# Patient Record
Sex: Female | Born: 1965 | Race: White | Hispanic: No | Smoking: Former smoker
Health system: Southern US, Community
[De-identification: ages and names within clinical notes are randomized; demographics above are authoritative.]

## PROBLEM LIST (undated history)

## (undated) DIAGNOSIS — I82409 Acute embolism and thrombosis of unspecified deep veins of unspecified lower extremity: Secondary | ICD-10-CM

## (undated) DIAGNOSIS — F32A Depression, unspecified: Secondary | ICD-10-CM

## (undated) DIAGNOSIS — R569 Unspecified convulsions: Secondary | ICD-10-CM

## (undated) DIAGNOSIS — F329 Major depressive disorder, single episode, unspecified: Secondary | ICD-10-CM

## (undated) DIAGNOSIS — T7840XA Allergy, unspecified, initial encounter: Secondary | ICD-10-CM

## (undated) DIAGNOSIS — F419 Anxiety disorder, unspecified: Secondary | ICD-10-CM

## (undated) HISTORY — DX: Anxiety disorder, unspecified: F41.9

## (undated) HISTORY — PX: COSMETIC SURGERY: SHX468

## (undated) HISTORY — PX: DILATION AND CURETTAGE OF UTERUS: SHX78

## (undated) HISTORY — PX: BREAST BIOPSY: SHX20

## (undated) HISTORY — DX: Major depressive disorder, single episode, unspecified: F32.9

## (undated) HISTORY — PX: AUGMENTATION MAMMAPLASTY: SUR837

## (undated) HISTORY — PX: NASAL SEPTUM SURGERY: SHX37

## (undated) HISTORY — PX: EYE SURGERY: SHX253

## (undated) HISTORY — PX: MINOR RELEASE DORSAL COMPARTMENT (DEQUERVAINS): SHX5980

## (undated) HISTORY — PX: MOUTH SURGERY: SHX715

## (undated) HISTORY — DX: Allergy, unspecified, initial encounter: T78.40XA

## (undated) HISTORY — DX: Depression, unspecified: F32.A

## (undated) HISTORY — DX: Acute embolism and thrombosis of unspecified deep veins of unspecified lower extremity: I82.409

## (undated) HISTORY — PX: FRACTURE SURGERY: SHX138

---

## 2013-11-24 DIAGNOSIS — F5101 Primary insomnia: Secondary | ICD-10-CM | POA: Insufficient documentation

## 2013-11-24 DIAGNOSIS — M654 Radial styloid tenosynovitis [de Quervain]: Secondary | ICD-10-CM | POA: Insufficient documentation

## 2014-05-31 ENCOUNTER — Other Ambulatory Visit: Payer: Self-pay | Admitting: Physician Assistant

## 2014-11-09 DIAGNOSIS — F411 Generalized anxiety disorder: Secondary | ICD-10-CM | POA: Insufficient documentation

## 2015-05-26 DIAGNOSIS — F329 Major depressive disorder, single episode, unspecified: Secondary | ICD-10-CM | POA: Insufficient documentation

## 2015-09-05 ENCOUNTER — Other Ambulatory Visit: Payer: Self-pay | Admitting: Obstetrics & Gynecology

## 2015-09-05 DIAGNOSIS — N6310 Unspecified lump in the right breast, unspecified quadrant: Secondary | ICD-10-CM

## 2015-09-12 ENCOUNTER — Other Ambulatory Visit: Payer: Self-pay | Admitting: Obstetrics & Gynecology

## 2015-09-12 DIAGNOSIS — N6310 Unspecified lump in the right breast, unspecified quadrant: Secondary | ICD-10-CM

## 2015-09-13 ENCOUNTER — Ambulatory Visit
Admission: RE | Admit: 2015-09-13 | Discharge: 2015-09-13 | Disposition: A | Discharge status: Home / Self Care | Payer: BLUE CROSS/BLUE SHIELD | Source: Ambulatory Visit | Attending: Obstetrics & Gynecology | Admitting: Obstetrics & Gynecology

## 2015-09-13 DIAGNOSIS — N6310 Unspecified lump in the right breast, unspecified quadrant: Secondary | ICD-10-CM

## 2016-01-30 ENCOUNTER — Other Ambulatory Visit: Payer: Self-pay | Admitting: Obstetrics & Gynecology

## 2016-01-30 DIAGNOSIS — N63 Unspecified lump in unspecified breast: Secondary | ICD-10-CM

## 2016-03-19 ENCOUNTER — Ambulatory Visit
Admission: RE | Admit: 2016-03-19 | Discharge: 2016-03-19 | Disposition: A | Discharge status: Home / Self Care | Payer: BLUE CROSS/BLUE SHIELD | Source: Ambulatory Visit | Attending: Obstetrics & Gynecology | Admitting: Obstetrics & Gynecology

## 2016-03-19 DIAGNOSIS — N63 Unspecified lump in unspecified breast: Secondary | ICD-10-CM

## 2016-07-02 DIAGNOSIS — Z01419 Encounter for gynecological examination (general) (routine) without abnormal findings: Secondary | ICD-10-CM | POA: Insufficient documentation

## 2016-07-02 DIAGNOSIS — T8332XA Displacement of intrauterine contraceptive device, initial encounter: Secondary | ICD-10-CM | POA: Insufficient documentation

## 2016-07-02 DIAGNOSIS — N83201 Unspecified ovarian cyst, right side: Secondary | ICD-10-CM | POA: Insufficient documentation

## 2017-06-27 DIAGNOSIS — R232 Flushing: Secondary | ICD-10-CM | POA: Insufficient documentation

## 2017-06-27 DIAGNOSIS — N926 Irregular menstruation, unspecified: Secondary | ICD-10-CM | POA: Insufficient documentation

## 2017-08-07 ENCOUNTER — Other Ambulatory Visit: Payer: Self-pay | Admitting: Obstetrics & Gynecology

## 2017-08-07 DIAGNOSIS — Z1231 Encounter for screening mammogram for malignant neoplasm of breast: Secondary | ICD-10-CM

## 2017-09-15 ENCOUNTER — Ambulatory Visit
Admission: RE | Admit: 2017-09-15 | Discharge: 2017-09-15 | Disposition: A | Discharge status: Home / Self Care | Payer: BLUE CROSS/BLUE SHIELD | Source: Ambulatory Visit | Attending: Obstetrics & Gynecology | Admitting: Obstetrics & Gynecology

## 2017-09-15 DIAGNOSIS — Z1231 Encounter for screening mammogram for malignant neoplasm of breast: Secondary | ICD-10-CM

## 2017-12-04 ENCOUNTER — Emergency Department (HOSPITAL_COMMUNITY): Payer: BLUE CROSS/BLUE SHIELD

## 2017-12-04 ENCOUNTER — Emergency Department (HOSPITAL_COMMUNITY)
Admission: EM | Admit: 2017-12-04 | Discharge: 2017-12-05 | Disposition: A | Discharge status: Home / Self Care | Payer: BLUE CROSS/BLUE SHIELD | Attending: Emergency Medicine | Admitting: Emergency Medicine

## 2017-12-04 DIAGNOSIS — Y999 Unspecified external cause status: Secondary | ICD-10-CM | POA: Insufficient documentation

## 2017-12-04 DIAGNOSIS — Y92009 Unspecified place in unspecified non-institutional (private) residence as the place of occurrence of the external cause: Secondary | ICD-10-CM | POA: Insufficient documentation

## 2017-12-04 DIAGNOSIS — W01190A Fall on same level from slipping, tripping and stumbling with subsequent striking against furniture, initial encounter: Secondary | ICD-10-CM | POA: Diagnosis not present

## 2017-12-04 DIAGNOSIS — S42202A Unspecified fracture of upper end of left humerus, initial encounter for closed fracture: Secondary | ICD-10-CM

## 2017-12-04 DIAGNOSIS — Y939 Activity, unspecified: Secondary | ICD-10-CM | POA: Insufficient documentation

## 2017-12-04 DIAGNOSIS — M25512 Pain in left shoulder: Secondary | ICD-10-CM | POA: Diagnosis present

## 2017-12-04 MED ORDER — FENTANYL CITRATE (PF) 100 MCG/2ML IJ SOLN
100.0000 ug | Freq: Once | INTRAMUSCULAR | Status: AC
  Administered 2017-12-04: 100 ug via INTRAVENOUS
  Filled 2017-12-04: qty 2

## 2017-12-04 NOTE — ED Notes (Signed)
Bed: [id number] Expected date:  Expected time:  Means of arrival:  Comments: EMS 52 yo female injury left scapula/? deformity

## 2017-12-04 NOTE — ED Triage Notes (Signed)
PT BIB [facility]. Pt fell into the arm of her couch and hurt her left shoulder. PT denies LOC, back pain or neck pain, denies hitting her head. PMS intact in her left UE. Pt has had of fentanyl IV en route via EMS. Pt was ambulatory on scene.

## 2017-12-05 ENCOUNTER — Emergency Department (HOSPITAL_COMMUNITY): Payer: BLUE CROSS/BLUE SHIELD

## 2017-12-05 MED ORDER — ONDANSETRON HCL 4 MG/2ML IJ SOLN
4.0000 mg | Freq: Once | INTRAMUSCULAR | Status: AC
Start: 2017-12-05 — End: 2017-12-05
  Administered 2017-12-05: 4 mg via INTRAVENOUS
  Filled 2017-12-05: qty 2

## 2017-12-05 MED ORDER — HYDROCODONE-ACETAMINOPHEN 5-325 MG PO TABS: 1.0000 | ORAL_TABLET | ORAL | 0 refills | Status: DC | PRN

## 2017-12-05 MED ORDER — HYDROCODONE-ACETAMINOPHEN 5-325 MG PO TABS
1.0000 | ORAL_TABLET | Freq: Once | ORAL | Status: AC
  Administered 2017-12-05: 1 via ORAL
  Filled 2017-12-05: qty 1

## 2017-12-05 MED ORDER — HYDROMORPHONE HCL 1 MG/ML IJ SOLN
1.0000 mg | Freq: Once | INTRAMUSCULAR | Status: AC
  Administered 2017-12-05: 1 mg via INTRAVENOUS
  Filled 2017-12-05: qty 1

## 2017-12-05 NOTE — ED Provider Notes (Signed)
[name]-EMERGENCY DEPT Provider Note   CSN: [id number] Arrival date & time: 12/04/17  2225     History   Chief Complaint Chief Complaint  Patient presents with  . Shoulder Injury    HPI Vanessa Shah is a 52 y.o. female.  HPI  52 year old female presents with fall and left arm injury.  The pain is mostly in her mid and upper arm.  She was walking on wooden floors when she slipped on her puppy's urine and fell onto the couch.  Her arm struck the arm of the chair.  She thinks her head may have hit the couch but denies headache or loss of consciousness.  Has been able to ambulate since this happened but had to have help getting up because she felt dizzy after the fall.  Pain is severe but better after fentanyl.  EMS gave her fentanyl and she was given 100 mcg fentanyl by the nurse prior to me seeing her.  Denies any other pain including lower extremity pain or further arm pain.  No weakness or numbness.  No neck pain.  Denies any significant past medical history besides depression and anxiety.  No past medical history on file.  There are no active problems to display for this patient.   Past Surgical History:  Procedure Laterality Date  . AUGMENTATION MAMMAPLASTY    . BREAST BIOPSY      OB History    No data available       Home Medications    Prior to Admission medications   Medication Sig Start Date End Date Taking? Authorizing Provider  buPROPion (WELLBUTRIN XL) 150 MG 24 hr tablet TK 2 TS PO QAM 11/05/17  Yes [provider]  clonazePAM (KLONOPIN) 1 MG tablet TK 1 T PO PRA. DO NOT MIX WITH AMBIEN OR ALCHOHOL 11/03/17  Yes [provider]  doxycycline (VIBRA-TABS) 100 MG tablet TK 1 T PO BID FOR 7 DAYS 11/26/17  Yes [provider]  FLUoxetine (PROZAC) 40 MG capsule Take 40 mg by mouth daily.  11/27/17  Yes [provider]  meloxicam (MOBIC) 7.5 MG tablet Take 7.5 mg by mouth daily as needed for pain.  09/02/17   Yes [provider]  zolpidem (AMBIEN CR) 12.5 MG CR tablet TK 1 T PO QHS 11/13/17  Yes [provider]  amoxicillin-clavulanate (AUGMENTIN) 875-125 MG tablet  11/27/17   [provider]  HYDROcodone-acetaminophen (NORCO) 5-325 MG tablet Take 1-2 tablets by mouth every 4 (four) hours as needed for severe pain. 12/05/17   Pricilla Loveless, MD    Family History Family History  Adopted: Yes    Social History Social History   Tobacco Use  . Smoking status: Not on file  Substance Use Topics  . Alcohol use: Not on file  . Drug use: Not on file     Allergies   Morphine and related; Penicillins; and Sulfa antibiotics   Review of Systems Review of Systems  Musculoskeletal: Positive for arthralgias. Negative for neck pain.  Neurological: Negative for weakness, numbness and headaches.  All other systems reviewed and are negative.    Physical Exam Updated Vital Signs BP (!) 150/95   Pulse 78   Temp 98.9 F (37.2 C) (Oral)   Resp 18   SpO2 97%   Physical Exam  Constitutional: She is oriented to person, place, and time. She appears well-developed and well-nourished. No distress.  HENT:  Head: Normocephalic and atraumatic.  Right Ear: External ear normal.  Left Ear: External ear normal.  Nose: Nose normal.  Eyes: Right eye exhibits no discharge. Left eye exhibits no discharge.  Cardiovascular: Normal rate, regular rhythm and normal heart sounds.  Pulses:      Radial pulses are 2+ on the left side.  Pulmonary/Chest: Effort normal and breath sounds normal.  Abdominal: She exhibits no distension.  Musculoskeletal:       Left shoulder: She exhibits decreased range of motion and tenderness.       Left elbow: She exhibits normal range of motion, no swelling and no deformity. Tenderness found.       Left upper arm: She exhibits tenderness. She exhibits no laceration.       Left forearm: She exhibits no tenderness.  Normal strength and sensation in left  upper extremity, limited due to pain.  Normal sensation over the left shoulder.  Neurological: She is alert and oriented to person, place, and time.  Skin: Skin is warm and dry. She is not diaphoretic.  Nursing note and vitals reviewed.    ED Treatments / Results  Labs (all labs ordered are listed, but only abnormal results are displayed) Labs Reviewed - No data to display  EKG  EKG Interpretation None       Radiology Dg Elbow Complete Left  Result Date: 12/05/2017 CLINICAL DATA:  Posterior elbow pain EXAM: LEFT ELBOW - COMPLETE 3+ VIEW COMPARISON:  None. FINDINGS: Nonstandard lateral view limits evaluation for effusion. No gross fracture or malalignment is seen. IMPRESSION: Limited by positioning.  No obvious fracture or malalignment. Electronically Signed   By: Jasmine PangKim  Fujinaga M.D.   On: 12/05/2017 00:45   Dg Shoulder Left  Result Date: 12/04/2017 CLINICAL DATA:  Patient fell.  Left shoulder pain. EXAM: LEFT SHOULDER - 2+ VIEW COMPARISON:  None. FINDINGS: Neer category 2 part fracture of the left proximal humerus is noted with transverse fracture through the surgical neck of the humerus and anterior displacement of the humeral shaft relative to the humeral head. The humeral head maintains its articulation with the glenoid. There is a fracture of the humeral head noted as well without significant displacement. 12 mm of anterior displacement of the humeral shaft is noted. IMPRESSION: Comminuted Neer category 2 part fracture of the left proximal humerus involving the surgical neck and humeral head. Anterior displacement of the humeral shaft by 12 mm is noted. Electronically Signed   By: Tollie Ethavid  Kwon M.D.   On: 12/04/2017 23:37   Dg Humerus Left  Result Date: 12/04/2017 CLINICAL DATA:  Patient fell at home this evening. EXAM: LEFT HUMERUS - 2+ VIEW COMPARISON:  None. FINDINGS: The proximal humerus is included as part of the left shoulder radiographs acquired sequentially. There is an acute  displaced surgical neck fracture of the humerus. There is anterior displacement of the humeral shaft relative to the humeral head which maintains its articulation with the glenoid. Humeral head fracture is also noted without significant displacement. The included elbow joint appears intact. IMPRESSION: Neer category 2 part fracture involving the humeral head and surgical neck with anterior displacement of the humeral shaft by 12 mm. Electronically Signed   By: Tollie Ethavid  Kwon M.D.   On: 12/04/2017 23:36    Procedures Procedures (including critical care time)  Medications Ordered in ED Medications  HYDROcodone-acetaminophen (NORCO/VICODIN) 5-325 MG per tablet 1 tablet (not administered)  fentaNYL (SUBLIMAZE) injection 100 mcg (100 mcg Intravenous Given 12/04/17 2353)  HYDROmorphone (DILAUDID) injection 1 mg (1 mg Intravenous Given 12/05/17 0043)  ondansetron (ZOFRAN) injection  4 mg (4 mg Intravenous Given 12/05/17 0043)     Initial Impression / Assessment and Plan / ED Course  I have reviewed the triage vital signs and the nursing notes.  Pertinent labs & imaging results that were available during my care of the patient were reviewed by me and considered in my medical decision making (see chart for details).     Patient's x-ray shows proximal humerus fracture.  This was discussed with Dr. Magnus Ivan, who recommends follow-up this upcoming week in his office.  Sling for comfort and treatment.  Otherwise her pain is better and she has tolerated hydrocodone in the past so she will be given this.  Encouraged to use NSAIDs and Tylenol as well and hydrocodone for breakthrough pain.  She is neurovascular intact.  There is no other sign of injury.  Discharge home with return precautions.  Final Clinical Impressions(s) / ED Diagnoses   Final diagnoses:  Closed fracture of proximal end of left humerus, unspecified fracture morphology, initial encounter    ED Discharge Orders        Ordered     HYDROcodone-acetaminophen (NORCO) 5-325 MG tablet  Every 4 hours PRN     12/05/17 0123       Pricilla Loveless, MD 12/05/17 [phone]

## 2017-12-08 ENCOUNTER — Ambulatory Visit (INDEPENDENT_AMBULATORY_CARE_PROVIDER_SITE_OTHER): Payer: BLUE CROSS/BLUE SHIELD | Admitting: Physician Assistant

## 2017-12-08 ENCOUNTER — Encounter (INDEPENDENT_AMBULATORY_CARE_PROVIDER_SITE_OTHER): Payer: Self-pay | Admitting: Physician Assistant

## 2017-12-08 VITALS — Ht 69.0 in | Wt 165.0 lb

## 2017-12-08 DIAGNOSIS — S42202A Unspecified fracture of upper end of left humerus, initial encounter for closed fracture: Secondary | ICD-10-CM | POA: Diagnosis not present

## 2017-12-08 MED ORDER — METHOCARBAMOL 500 MG PO TABS: 500.0000 mg | ORAL_TABLET | Freq: Three times a day (TID) | ORAL | 1 refills | Status: DC

## 2017-12-08 MED ORDER — HYDROCODONE-ACETAMINOPHEN 7.5-325 MG PO TABS: 1.0000 | ORAL_TABLET | Freq: Four times a day (QID) | ORAL | 0 refills | Status: DC | PRN

## 2017-12-08 NOTE — Progress Notes (Signed)
Office Visit Note   Patient: Vanessa DurhamChristy Shah           Date of Birth: 18-Jun-1966           MRN: [id number] Visit Date: 12/08/2017              Requested by: Elizabeth PalauAnderson, Teresa, FNP [address] [place], [place] [id number] PCP: Elizabeth PalauAnderson, Teresa, FNP   Assessment & Plan: Visit Diagnoses:  1. Closed fracture of proximal end of left humerus, unspecified fracture morphology, initial encounter     Plan: I placed her on Norco 7.5/325 mg to try to better control her pain and also Robaxin.  After discussing conservative treatment and surgical treatment she wishes to proceed with surgical fixation of the left proximal humerus fracture.  Risks benefits discussed with the patient by myself and Dr. Magnus IvanBlackman along with the postoperative protocol.  Risk and include nerve vessel injury, prolonged pain, worsening pain and infection.  She will follow-up 2 weeks postop.  Follow-Up Instructions: Return for 2 weeks post-op.   Orders:  No orders of the defined types were placed in this encounter.  No orders of the defined types were placed in this encounter.     Procedures: No procedures performed   Clinical Data: No additional findings.   Subjective: Chief Complaint  Patient presents with  . Left Shoulder - Fracture    HPI Vanessa BonitoChristy is a 52 year old female who slipped in some puppy urine on 11/03/2018 at her home.  She sustained an injury to her left shoulder.  [facility] where she was found to have a closed left proximal humerus fracture.  She is placed in a sling given hydrocodone for pain.  She comes in today stating that the sling is not working right it slips.  Also that the pain medications really not helping.  She sustained no other injuries.  She is right-hand dominant. Radiographs are reviewed on the coned system left humerus left shoulder show a displaced surgical neck fracture and anterior displacement of the humeral shaft relative to the humeral  head.  Fracture of the humeral head is also seen with significant displacement.  Humeral head remains located with the glenoid.  Review of Systems Please see HPI otherwise negative  Objective: Vital Signs: Ht 5\' 9"  (1.753 m)   Wt 165 lb (74.8 kg)   BMI 24.37 kg/m   Physical Exam  Constitutional: She is oriented to person, place, and time. She appears well-developed and well-nourished. No distress.  Pulmonary/Chest: Effort normal.  Neurological: She is alert and oriented to person, place, and time.  Skin: She is not diaphoretic.  Psychiatric: She has a normal mood and affect. Her behavior is normal.    Ortho Exam Left arm in sling.  She is full sensation left hand.  She is able wiggle her fingers.  Radial pulses intact.  Sensation is intact throughout the hand.  Specialty Comments:  No specialty comments available.  Imaging: No results found.   PMFS History: There are no active problems to display for this patient.  Past Medical History:  Diagnosis Date  . Anxiety   . Deep vein thrombosis (HCC)   . Depression     Family History  Adopted: Yes    Past Surgical History:  Procedure Laterality Date  . AUGMENTATION MAMMAPLASTY    . BREAST BIOPSY    . MINOR RELEASE DORSAL COMPARTMENT (DEQUERVAINS)     Social History   Occupational History  .  Not on file  Tobacco Use  . Smoking status: Former Smoker    Years: 5.00  . Smokeless tobacco: Never Used  Substance and Sexual Activity  . Alcohol use: Yes    Comment: once a week  . Drug use: No  . Sexual activity: Not on file

## 2017-12-08 NOTE — Progress Notes (Signed)
Please place orders in Epic as patient is being scheduled for a pre-op appointment! Thank you! 

## 2017-12-09 ENCOUNTER — Other Ambulatory Visit (INDEPENDENT_AMBULATORY_CARE_PROVIDER_SITE_OTHER): Payer: Self-pay | Admitting: Physician Assistant

## 2017-12-09 NOTE — Patient Instructions (Addendum)
Vanessa DurhamChristy Shah  12/09/2017   Your procedure is scheduled on: 12-12-17   Report to [facility] Main  Entrance               Report to admitting at         250 PM   Call this number if you have problems the morning of surgery [phone]    Remember: Do not eat food:After Midnight.  YOU MAY HAVE CLEAR LIQUIDS UNTIL 1050 AM THEN NOTHING BY MOUTH     Take these medicines the morning of surgery with A SIP OF WATER: HYDROCODONE IF NEEDED, PROZAC,WELLBUTRIN                                You may not have any metal on your body including hair pins and              piercings  Do not wear jewelry, make-up, lotions, powders or perfumes, deodorant             Do not wear nail polish.  Do not shave  48 hours prior to surgery.           Do not bring valuables to the hospital. [facility] IS NOT             RESPONSIBLE   FOR VALUABLES.  Contacts, dentures or bridgework may not be worn into surgery.  Leave suitcase in the car. After surgery it may be brought to your room.                  Please read over the following fact sheets you were given: _____________________________________________________________________          [facility] - Preparing for Surgery Before surgery, you can play an important role.  Because skin is not sterile, your skin needs to be as free of germs as possible.  You can reduce the number of germs on your skin by washing with CHG (chlorahexidine gluconate) soap before surgery.  CHG is an antiseptic cleaner which kills germs and bonds with the skin to continue killing germs even after washing. Please DO NOT use if you have an allergy to CHG or antibacterial soaps.  If your skin becomes reddened/irritated stop using the CHG and inform your nurse when you arrive at Short Stay. Do not shave (including legs and underarms) for at least 48 hours prior to the first CHG shower.  You may shave your face/neck. Please follow these instructions carefully:  1.   Shower with CHG Soap the night before surgery and the  morning of Surgery.  2.  If you choose to wash your hair, wash your hair first as usual with your  normal  shampoo.  3.  After you shampoo, rinse your hair and body thoroughly to remove the  shampoo.                           4.  Use CHG as you would any other liquid soap.  You can apply chg directly  to the skin and wash                       Gently with a scrungie or clean washcloth.  5.  Apply the CHG Soap to your body ONLY FROM THE  NECK DOWN.   Do not use on face/ open                           Wound or open sores. Avoid contact with eyes, ears mouth and genitals (private parts).                       Wash face,  Genitals (private parts) with your normal soap.             6.  Wash thoroughly, paying special attention to the area where your surgery  will be performed.  7.  Thoroughly rinse your body with warm water from the neck down.  8.  DO NOT shower/wash with your normal soap after using and rinsing off  the CHG Soap.                9.  Pat yourself dry with a clean towel.            10.  Wear clean pajamas.            11.  Place clean sheets on your bed the night of your first shower and do not  sleep with pets. Day of Surgery : Do not apply any lotions/deodorants the morning of surgery.  Please wear clean clothes to the hospital/surgery center.  FAILURE TO FOLLOW THESE INSTRUCTIONS MAY RESULT IN THE CANCELLATION OF YOUR SURGERY PATIENT SIGNATURE_________________________________  NURSE SIGNATURE__________________________________  ________________________________________________________________________    CLEAR LIQUID DIET   Foods Allowed                                                                     Foods Excluded  Coffee and tea, regular and decaf                             liquids that you cannot  Plain Jell-O in any flavor                                             see through such as: Fruit ices (not with fruit  pulp)                                     milk, soups, orange juice  Iced Popsicles                                    All solid food Carbonated beverages, regular and diet                                    Cranberry, grape and apple juices Sports drinks like Gatorade Lightly seasoned clear broth or consume(fat free) Sugar, honey syrup  Sample Menu Breakfast  Lunch                                     Supper Cranberry juice                    Beef broth                            Chicken broth Jell-O                                     Grape juice                           Apple juice Coffee or tea                        Jell-O                                      Popsicle                                                Coffee or tea                        Coffee or tea  _____________________________________________________________________   Incentive Spirometer  An incentive spirometer is a tool that can help keep your lungs clear and active. This tool measures how well you are filling your lungs with each breath. Taking long deep breaths may help reverse or decrease the chance of developing breathing (pulmonary) problems (especially infection) following:  A long period of time when you are unable to move or be active. BEFORE THE PROCEDURE   If the spirometer includes an indicator to show your best effort, your nurse or respiratory therapist will set it to a desired goal.  If possible, sit up straight or lean slightly forward. Try not to slouch.  Hold the incentive spirometer in an upright position. INSTRUCTIONS FOR USE  1. Sit on the edge of your bed if possible, or sit up as far as you can in bed or on a chair. 2. Hold the incentive spirometer in an upright position. 3. Breathe out normally. 4. Place the mouthpiece in your mouth and seal your lips tightly around it. 5. Breathe in slowly and as deeply as possible, raising the piston or the ball toward  the top of the column. 6. Hold your breath for 3-5 seconds or for as long as possible. Allow the piston or ball to fall to the bottom of the column. 7. Remove the mouthpiece from your mouth and breathe out normally. 8. Rest for a few seconds and repeat Steps 1 through 7 at least 10 times every 1-2 hours when you are awake. Take your time and take a few normal breaths between deep breaths. 9. The spirometer may include an indicator to show your best effort. Use the indicator as a goal to work toward during each repetition. 10. After each set of 10 deep breaths, practice  coughing to be sure your lungs are clear. If you have an incision (the cut made at the time of surgery), support your incision when coughing by placing a pillow or rolled up towels firmly against it. Once you are able to get out of bed, walk around indoors and cough well. You may stop using the incentive spirometer when instructed by your caregiver.  RISKS AND COMPLICATIONS  Take your time so you do not get dizzy or light-headed.  If you are in pain, you may need to take or ask for pain medication before doing incentive spirometry. It is harder to take a deep breath if you are having pain. AFTER USE  Rest and breathe slowly and easily.  It can be helpful to keep track of a log of your progress. Your caregiver can provide you with a simple table to help with this. If you are using the spirometer at home, follow these instructions: SEEK MEDICAL CARE IF:   You are having difficultly using the spirometer.  You have trouble using the spirometer as often as instructed.  Your pain medication is not giving enough relief while using the spirometer.  You develop fever of 100.5 F (38.1 C) or higher. SEEK IMMEDIATE MEDICAL CARE IF:   You cough up bloody sputum that had not been present before.  You develop fever of 102 F (38.9 C) or greater.  You develop worsening pain at or near the incision site. MAKE SURE YOU:    Understand these instructions.  Will watch your condition.  Will get help right away if you are not doing well or get worse. Document Released: 03/10/2007 Document Revised: 01/20/2012 Document Reviewed: 05/11/2007 [facility] Patient Information 2014 [place], [place].   ________________________________________________________________________

## 2017-12-10 ENCOUNTER — Other Ambulatory Visit: Payer: Self-pay

## 2017-12-10 ENCOUNTER — Encounter (HOSPITAL_COMMUNITY): Payer: Self-pay

## 2017-12-10 ENCOUNTER — Encounter (HOSPITAL_COMMUNITY)
Admission: RE | Admit: 2017-12-10 | Discharge: 2017-12-10 | Disposition: A | Discharge status: Home / Self Care | Payer: BLUE CROSS/BLUE SHIELD | Source: Ambulatory Visit | Attending: Orthopaedic Surgery | Admitting: Orthopaedic Surgery

## 2017-12-10 DIAGNOSIS — F419 Anxiety disorder, unspecified: Secondary | ICD-10-CM | POA: Diagnosis not present

## 2017-12-10 DIAGNOSIS — Z87891 Personal history of nicotine dependence: Secondary | ICD-10-CM | POA: Diagnosis not present

## 2017-12-10 DIAGNOSIS — W010XXA Fall on same level from slipping, tripping and stumbling without subsequent striking against object, initial encounter: Secondary | ICD-10-CM | POA: Diagnosis not present

## 2017-12-10 DIAGNOSIS — Z882 Allergy status to sulfonamides status: Secondary | ICD-10-CM | POA: Diagnosis not present

## 2017-12-10 DIAGNOSIS — Y939 Activity, unspecified: Secondary | ICD-10-CM | POA: Diagnosis not present

## 2017-12-10 DIAGNOSIS — Z885 Allergy status to narcotic agent status: Secondary | ICD-10-CM | POA: Diagnosis not present

## 2017-12-10 DIAGNOSIS — Z8669 Personal history of other diseases of the nervous system and sense organs: Secondary | ICD-10-CM | POA: Diagnosis not present

## 2017-12-10 DIAGNOSIS — Z7982 Long term (current) use of aspirin: Secondary | ICD-10-CM | POA: Diagnosis not present

## 2017-12-10 DIAGNOSIS — Z880 Allergy status to penicillin: Secondary | ICD-10-CM | POA: Diagnosis not present

## 2017-12-10 DIAGNOSIS — S42212A Unspecified displaced fracture of surgical neck of left humerus, initial encounter for closed fracture: Secondary | ICD-10-CM | POA: Diagnosis present

## 2017-12-10 DIAGNOSIS — Z86718 Personal history of other venous thrombosis and embolism: Secondary | ICD-10-CM | POA: Diagnosis not present

## 2017-12-10 DIAGNOSIS — F329 Major depressive disorder, single episode, unspecified: Secondary | ICD-10-CM | POA: Diagnosis not present

## 2017-12-10 DIAGNOSIS — S42232A 3-part fracture of surgical neck of left humerus, initial encounter for closed fracture: Secondary | ICD-10-CM | POA: Diagnosis not present

## 2017-12-10 DIAGNOSIS — Z79899 Other long term (current) drug therapy: Secondary | ICD-10-CM | POA: Diagnosis not present

## 2017-12-10 HISTORY — DX: Unspecified convulsions: R56.9

## 2017-12-10 LAB — BASIC METABOLIC PANEL
ANION GAP: 5 (ref 5–15)
BUN: 11 mg/dL (ref 6–20)
CHLORIDE: 106 mmol/L (ref 101–111)
CO2: 27 mmol/L (ref 22–32)
Calcium: 9 mg/dL (ref 8.9–10.3)
Creatinine, Ser: 0.64 mg/dL (ref 0.44–1.00)
GFR calc Af Amer: >60 mL/min (ref >60)
GFR calc non Af Amer: >60 mL/min (ref >60)
Glucose, Bld: 93 mg/dL (ref 65–99)
Potassium: 4.4 mmol/L (ref 3.5–5.1)
Sodium: 138 mmol/L (ref 135–145)

## 2017-12-10 LAB — CBC
HEMATOCRIT: 35.1 % — AB (ref 36.0–46.0)
Hemoglobin: 11.6 g/dL — ABNORMAL LOW (ref 12.0–15.0)
MCH: 30.2 pg (ref 26.0–34.0)
MCHC: 33 g/dL (ref 30.0–36.0)
MCV: 91.4 fL (ref 78.0–100.0)
PLATELETS: 237 10*3/uL (ref 150–400)
RBC: 3.84 MIL/uL — AB (ref 3.87–5.11)
RDW: 12.6 % (ref 11.5–15.5)
WBC: 8.3 10*3/uL (ref 4.0–10.5)

## 2017-12-10 LAB — HCG, SERUM, QUALITATIVE: PREG SERUM: NEGATIVE

## 2017-12-11 NOTE — Progress Notes (Signed)
Spoke with Vanessa Shah and informed her of time change of her surgery . She is aware surgery time is 6:20 pm and to arrive at 4:20 pm. She is also aware she can have clear liquids until 1220 pm.

## 2017-12-12 ENCOUNTER — Ambulatory Visit (HOSPITAL_COMMUNITY): Payer: BLUE CROSS/BLUE SHIELD

## 2017-12-12 ENCOUNTER — Ambulatory Visit (HOSPITAL_COMMUNITY): Payer: BLUE CROSS/BLUE SHIELD | Admitting: Anesthesiology

## 2017-12-12 ENCOUNTER — Encounter (HOSPITAL_COMMUNITY): Payer: Self-pay | Admitting: *Deleted

## 2017-12-12 ENCOUNTER — Observation Stay (HOSPITAL_COMMUNITY): Payer: BLUE CROSS/BLUE SHIELD

## 2017-12-12 ENCOUNTER — Other Ambulatory Visit: Payer: Self-pay

## 2017-12-12 ENCOUNTER — Observation Stay (HOSPITAL_COMMUNITY)
Admission: RE | Admit: 2017-12-12 | Discharge: 2017-12-13 | Disposition: A | Discharge status: Home / Self Care | Payer: BLUE CROSS/BLUE SHIELD | Source: Ambulatory Visit | Attending: Orthopaedic Surgery | Admitting: Orthopaedic Surgery

## 2017-12-12 ENCOUNTER — Encounter (HOSPITAL_COMMUNITY)
Admission: RE | Disposition: A | Discharge status: Home / Self Care | Payer: Self-pay | Source: Ambulatory Visit | Attending: Orthopaedic Surgery

## 2017-12-12 DIAGNOSIS — Z9889 Other specified postprocedural states: Secondary | ICD-10-CM

## 2017-12-12 DIAGNOSIS — S42292A Other displaced fracture of upper end of left humerus, initial encounter for closed fracture: Secondary | ICD-10-CM | POA: Diagnosis not present

## 2017-12-12 DIAGNOSIS — Z86718 Personal history of other venous thrombosis and embolism: Secondary | ICD-10-CM | POA: Insufficient documentation

## 2017-12-12 DIAGNOSIS — Z7982 Long term (current) use of aspirin: Secondary | ICD-10-CM | POA: Insufficient documentation

## 2017-12-12 DIAGNOSIS — Z8669 Personal history of other diseases of the nervous system and sense organs: Secondary | ICD-10-CM | POA: Insufficient documentation

## 2017-12-12 DIAGNOSIS — Z87891 Personal history of nicotine dependence: Secondary | ICD-10-CM | POA: Insufficient documentation

## 2017-12-12 DIAGNOSIS — F329 Major depressive disorder, single episode, unspecified: Secondary | ICD-10-CM | POA: Insufficient documentation

## 2017-12-12 DIAGNOSIS — S42202A Unspecified fracture of upper end of left humerus, initial encounter for closed fracture: Secondary | ICD-10-CM | POA: Diagnosis present

## 2017-12-12 DIAGNOSIS — Z880 Allergy status to penicillin: Secondary | ICD-10-CM | POA: Insufficient documentation

## 2017-12-12 DIAGNOSIS — Z419 Encounter for procedure for purposes other than remedying health state, unspecified: Secondary | ICD-10-CM

## 2017-12-12 DIAGNOSIS — S42222A 2-part displaced fracture of surgical neck of left humerus, initial encounter for closed fracture: Principal | ICD-10-CM | POA: Insufficient documentation

## 2017-12-12 DIAGNOSIS — Y939 Activity, unspecified: Secondary | ICD-10-CM | POA: Insufficient documentation

## 2017-12-12 DIAGNOSIS — W010XXA Fall on same level from slipping, tripping and stumbling without subsequent striking against object, initial encounter: Secondary | ICD-10-CM | POA: Insufficient documentation

## 2017-12-12 DIAGNOSIS — Z885 Allergy status to narcotic agent status: Secondary | ICD-10-CM | POA: Insufficient documentation

## 2017-12-12 DIAGNOSIS — S42232A 3-part fracture of surgical neck of left humerus, initial encounter for closed fracture: Secondary | ICD-10-CM | POA: Diagnosis not present

## 2017-12-12 DIAGNOSIS — Z79899 Other long term (current) drug therapy: Secondary | ICD-10-CM | POA: Insufficient documentation

## 2017-12-12 DIAGNOSIS — Z882 Allergy status to sulfonamides status: Secondary | ICD-10-CM | POA: Insufficient documentation

## 2017-12-12 DIAGNOSIS — F419 Anxiety disorder, unspecified: Secondary | ICD-10-CM | POA: Insufficient documentation

## 2017-12-12 HISTORY — PX: ORIF HUMERUS FRACTURE: SHX2126

## 2017-12-12 SURGERY — OPEN REDUCTION INTERNAL FIXATION (ORIF) PROXIMAL HUMERUS FRACTURE
Anesthesia: General | Site: Shoulder | Laterality: Left

## 2017-12-12 MED ORDER — PHENYLEPHRINE HCL 10 MG/ML IJ SOLN
INTRAMUSCULAR | Status: AC
  Filled 2017-12-12: qty 1

## 2017-12-12 MED ORDER — METOCLOPRAMIDE HCL 5 MG PO TABS: 5.0000 mg | ORAL_TABLET | Freq: Three times a day (TID) | ORAL | Status: DC | PRN

## 2017-12-12 MED ORDER — MIDAZOLAM HCL 5 MG/5ML IJ SOLN
INTRAMUSCULAR | Status: DC | PRN
  Administered 2017-12-12: 2 mg via INTRAVENOUS

## 2017-12-12 MED ORDER — SUGAMMADEX SODIUM 200 MG/2ML IV SOLN
INTRAVENOUS | Status: DC | PRN
  Administered 2017-12-12: 150 mg via INTRAVENOUS

## 2017-12-12 MED ORDER — METHOCARBAMOL 1000 MG/10ML IJ SOLN
500.0000 mg | Freq: Four times a day (QID) | INTRAVENOUS | Status: DC | PRN
  Filled 2017-12-12: qty 5

## 2017-12-12 MED ORDER — BUPROPION HCL ER (XL) 300 MG PO TB24
300.0000 mg | ORAL_TABLET | Freq: Every day | ORAL | Status: DC
  Administered 2017-12-13: 10:00:00 300 mg via ORAL
  Filled 2017-12-12: qty 1

## 2017-12-12 MED ORDER — LIDOCAINE 2% (20 MG/ML) 5 ML SYRINGE
INTRAMUSCULAR | Status: AC
  Filled 2017-12-12: qty 5

## 2017-12-12 MED ORDER — OXYCODONE HCL 5 MG PO TABS
10.0000 mg | ORAL_TABLET | ORAL | Status: DC | PRN
  Administered 2017-12-13: 11:00:00 10 mg via ORAL
  Filled 2017-12-12: qty 2

## 2017-12-12 MED ORDER — DEXAMETHASONE SODIUM PHOSPHATE 10 MG/ML IJ SOLN
INTRAMUSCULAR | Status: AC
  Filled 2017-12-12: qty 1

## 2017-12-12 MED ORDER — MIDAZOLAM HCL 2 MG/2ML IJ SOLN
INTRAMUSCULAR | Status: AC
  Filled 2017-12-12: qty 2

## 2017-12-12 MED ORDER — MEPERIDINE HCL 50 MG/ML IJ SOLN: 6.2500 mg | INTRAMUSCULAR | Status: DC | PRN

## 2017-12-12 MED ORDER — FENTANYL CITRATE (PF) 250 MCG/5ML IJ SOLN
INTRAMUSCULAR | Status: AC
  Filled 2017-12-12: qty 5

## 2017-12-12 MED ORDER — SUGAMMADEX SODIUM 200 MG/2ML IV SOLN
INTRAVENOUS | Status: AC
  Filled 2017-12-12: qty 2

## 2017-12-12 MED ORDER — ONDANSETRON HCL 4 MG/2ML IJ SOLN: 4.0000 mg | Freq: Four times a day (QID) | INTRAMUSCULAR | Status: DC | PRN

## 2017-12-12 MED ORDER — PHENYLEPHRINE HCL 10 MG/ML IJ SOLN
INTRAMUSCULAR | Status: DC | PRN
  Administered 2017-12-12: 120 ug via INTRAVENOUS
  Administered 2017-12-12: 80 ug via INTRAVENOUS
  Administered 2017-12-12: 120 ug via INTRAVENOUS

## 2017-12-12 MED ORDER — PROMETHAZINE HCL 25 MG/ML IJ SOLN: 6.2500 mg | INTRAMUSCULAR | Status: DC | PRN

## 2017-12-12 MED ORDER — SODIUM CHLORIDE 0.9 % IV SOLN: INTRAVENOUS | Status: DC

## 2017-12-12 MED ORDER — PROPOFOL 10 MG/ML IV BOLUS
INTRAVENOUS | Status: AC
  Filled 2017-12-12: qty 20

## 2017-12-12 MED ORDER — BUPIVACAINE-EPINEPHRINE (PF) 0.5% -1:200000 IJ SOLN
INTRAMUSCULAR | Status: AC
  Filled 2017-12-12: qty 30

## 2017-12-12 MED ORDER — LIDOCAINE HCL (CARDIAC) 20 MG/ML IV SOLN
INTRAVENOUS | Status: DC | PRN
  Administered 2017-12-12: 75 mg via INTRAVENOUS
  Administered 2017-12-12: 25 mg via INTRATRACHEAL

## 2017-12-12 MED ORDER — PHENYLEPHRINE 40 MCG/ML (10ML) SYRINGE FOR IV PUSH (FOR BLOOD PRESSURE SUPPORT)
PREFILLED_SYRINGE | INTRAVENOUS | Status: AC
  Filled 2017-12-12: qty 10

## 2017-12-12 MED ORDER — CLINDAMYCIN PHOSPHATE 900 MG/50ML IV SOLN
900.0000 mg | INTRAVENOUS | Status: AC
  Administered 2017-12-12: 900 mg via INTRAVENOUS
  Filled 2017-12-12: qty 50

## 2017-12-12 MED ORDER — PHENYLEPHRINE HCL 10 MG/ML IJ SOLN
INTRAVENOUS | Status: DC | PRN
  Administered 2017-12-12: 25 ug/min via INTRAVENOUS

## 2017-12-12 MED ORDER — FENTANYL CITRATE (PF) 100 MCG/2ML IJ SOLN
25.0000 ug | INTRAMUSCULAR | Status: DC | PRN
  Administered 2017-12-12 (×2): 25 ug via INTRAVENOUS

## 2017-12-12 MED ORDER — SODIUM CHLORIDE 0.9 % IR SOLN
Status: DC | PRN
  Administered 2017-12-12: 1000 mL

## 2017-12-12 MED ORDER — FLUOXETINE HCL 20 MG PO CAPS
40.0000 mg | ORAL_CAPSULE | Freq: Every day | ORAL | Status: DC
  Administered 2017-12-13: 40 mg via ORAL
  Filled 2017-12-12: qty 2

## 2017-12-12 MED ORDER — ROCURONIUM BROMIDE 100 MG/10ML IV SOLN
INTRAVENOUS | Status: DC | PRN
  Administered 2017-12-12: 60 mg via INTRAVENOUS

## 2017-12-12 MED ORDER — ONDANSETRON HCL 4 MG/2ML IJ SOLN
INTRAMUSCULAR | Status: DC | PRN
  Administered 2017-12-12: 4 mg via INTRAVENOUS

## 2017-12-12 MED ORDER — ROCURONIUM BROMIDE 10 MG/ML (PF) SYRINGE
PREFILLED_SYRINGE | INTRAVENOUS | Status: AC
  Filled 2017-12-12: qty 5

## 2017-12-12 MED ORDER — 0.9 % SODIUM CHLORIDE (POUR BTL) OPTIME
TOPICAL | Status: DC | PRN
  Administered 2017-12-12: 1000 mL

## 2017-12-12 MED ORDER — ONDANSETRON HCL 4 MG/2ML IJ SOLN
INTRAMUSCULAR | Status: AC
  Filled 2017-12-12: qty 2

## 2017-12-12 MED ORDER — LACTATED RINGERS IV SOLN
INTRAVENOUS | Status: DC
  Administered 2017-12-12 (×3): via INTRAVENOUS

## 2017-12-12 MED ORDER — ACETAMINOPHEN 650 MG RE SUPP: 650.0000 mg | RECTAL | Status: DC | PRN

## 2017-12-12 MED ORDER — ACETAMINOPHEN 325 MG PO TABS: 650.0000 mg | ORAL_TABLET | ORAL | Status: DC | PRN

## 2017-12-12 MED ORDER — PROPOFOL 10 MG/ML IV BOLUS
INTRAVENOUS | Status: DC | PRN
  Administered 2017-12-12: 160 mg via INTRAVENOUS

## 2017-12-12 MED ORDER — FENTANYL CITRATE (PF) 100 MCG/2ML IJ SOLN
INTRAMUSCULAR | Status: AC
  Filled 2017-12-12: qty 2

## 2017-12-12 MED ORDER — METHOCARBAMOL 500 MG PO TABS
500.0000 mg | ORAL_TABLET | Freq: Four times a day (QID) | ORAL | Status: DC | PRN
  Administered 2017-12-13: 500 mg via ORAL
  Filled 2017-12-12: qty 1

## 2017-12-12 MED ORDER — FENTANYL CITRATE (PF) 100 MCG/2ML IJ SOLN
INTRAMUSCULAR | Status: DC | PRN
  Administered 2017-12-12: 100 ug via INTRAVENOUS
  Administered 2017-12-12: 50 ug via INTRAVENOUS
  Administered 2017-12-12: 100 ug via INTRAVENOUS

## 2017-12-12 MED ORDER — DIPHENHYDRAMINE HCL 12.5 MG/5ML PO ELIX
12.5000 mg | ORAL_SOLUTION | ORAL | Status: DC | PRN
  Administered 2017-12-13: 25 mg via ORAL
  Filled 2017-12-12: qty 10

## 2017-12-12 MED ORDER — DEXAMETHASONE SODIUM PHOSPHATE 10 MG/ML IJ SOLN
INTRAMUSCULAR | Status: DC | PRN
  Administered 2017-12-12: 10 mg via INTRAVENOUS

## 2017-12-12 MED ORDER — CLINDAMYCIN PHOSPHATE 600 MG/50ML IV SOLN
600.0000 mg | Freq: Four times a day (QID) | INTRAVENOUS | Status: AC
  Administered 2017-12-12 – 2017-12-13 (×3): 600 mg via INTRAVENOUS
  Filled 2017-12-12 (×3): qty 50

## 2017-12-12 MED ORDER — CHLORHEXIDINE GLUCONATE 4 % EX LIQD: 60.0000 mL | Freq: Once | CUTANEOUS | Status: DC

## 2017-12-12 MED ORDER — CLONAZEPAM 1 MG PO TABS
1.0000 mg | ORAL_TABLET | Freq: Every day | ORAL | Status: DC
  Administered 2017-12-12: 1 mg via ORAL
  Filled 2017-12-12: qty 1

## 2017-12-12 MED ORDER — HYDROMORPHONE HCL 1 MG/ML IJ SOLN
1.0000 mg | INTRAMUSCULAR | Status: DC | PRN
  Administered 2017-12-12 – 2017-12-13 (×2): 1 mg via INTRAVENOUS
  Filled 2017-12-12 (×2): qty 1

## 2017-12-12 MED ORDER — ONDANSETRON HCL 4 MG PO TABS: 4.0000 mg | ORAL_TABLET | Freq: Four times a day (QID) | ORAL | Status: DC | PRN

## 2017-12-12 MED ORDER — HYDROCODONE-ACETAMINOPHEN 5-325 MG PO TABS: 1.0000 | ORAL_TABLET | ORAL | Status: DC | PRN

## 2017-12-12 MED ORDER — METOCLOPRAMIDE HCL 5 MG/ML IJ SOLN: 5.0000 mg | Freq: Three times a day (TID) | INTRAMUSCULAR | Status: DC | PRN

## 2017-12-12 SURGICAL SUPPLY — 52 items
BAG ZIPLOCK 12X15 (MISCELLANEOUS) ×3 IMPLANT
BENZOIN TINCTURE PRP APPL 2/3 (GAUZE/BANDAGES/DRESSINGS) ×3 IMPLANT
BIT DRILL 3.2 (BIT) ×2
BIT DRILL 3.2XCALB NS DISP (BIT) ×1 IMPLANT
BIT DRILL CALIBRATED 2.7 (BIT) ×2 IMPLANT
BIT DRILL CALIBRATED 2.7MM (BIT) ×1
BIT DRL 3.2XCALB NS DISP (BIT) ×1
CLOSURE WOUND 1/2 X4 (GAUZE/BANDAGES/DRESSINGS) ×2
COVER SURGICAL LIGHT HANDLE (MISCELLANEOUS) ×3 IMPLANT
CUFF TOURN SGL QUICK 18 (TOURNIQUET CUFF) IMPLANT
DRAPE C-ARM 42X120 X-RAY (DRAPES) ×3 IMPLANT
DRAPE TOP 10253 STERILE (DRAPES) ×3 IMPLANT
DRAPE U-SHAPE 47X51 STRL (DRAPES) ×3 IMPLANT
DRSG AQUACEL AG ADV 3.5X 6 (GAUZE/BANDAGES/DRESSINGS) ×3 IMPLANT
DRSG EMULSION OIL 3X3 NADH (GAUZE/BANDAGES/DRESSINGS) ×3 IMPLANT
DRSG PAD ABDOMINAL 8X10 ST (GAUZE/BANDAGES/DRESSINGS) ×3 IMPLANT
DURAPREP 26ML APPLICATOR (WOUND CARE) ×3 IMPLANT
ELECT REM PT RETURN 15FT ADLT (MISCELLANEOUS) ×3 IMPLANT
GAUZE SPONGE 4X4 12PLY STRL (GAUZE/BANDAGES/DRESSINGS) ×3 IMPLANT
GLOVE BIO SURGEON STRL SZ7.5 (GLOVE) ×3 IMPLANT
GLOVE BIOGEL PI IND STRL 8 (GLOVE) ×1 IMPLANT
GLOVE BIOGEL PI INDICATOR 8 (GLOVE) ×2
GLOVE ECLIPSE 8.0 STRL XLNG CF (GLOVE) ×3 IMPLANT
GOWN STRL REUS W/TWL XL LVL3 (GOWN DISPOSABLE) ×3 IMPLANT
HANDPIECE INTERPULSE COAX TIP (DISPOSABLE) ×2
K-WIRE 2X5 SS THRDED S3 (WIRE) ×6
KWIRE 2X5 SS THRDED S3 (WIRE) ×2 IMPLANT
NS IRRIG 1000ML POUR BTL (IV SOLUTION) ×3 IMPLANT
PACK ORTHO EXTREMITY (CUSTOM PROCEDURE TRAY) IMPLANT
PACK SHOULDER (CUSTOM PROCEDURE TRAY) ×3 IMPLANT
PAD CAST 4YDX4 CTTN HI CHSV (CAST SUPPLIES) ×1 IMPLANT
PADDING CAST COTTON 4X4 STRL (CAST SUPPLIES) ×2
PEG LOCKING 3.2X36 (Screw) ×3 IMPLANT
PEG LOCKING 3.2X38 (Screw) ×6 IMPLANT
PEG LOCKING 3.2X40 (Peg) ×3 IMPLANT
PEG LOCKING 3.2X48 (Peg) ×6 IMPLANT
PLATE HUMERUS LP PROX L 3H (Plate) ×3 IMPLANT
POSITIONER SURGICAL ARM (MISCELLANEOUS) ×3 IMPLANT
SCREW LOCK CORT STAR 3.5X28 (Screw) ×3 IMPLANT
SCREW LOCK CORT STAR 3.5X30 (Screw) ×3 IMPLANT
SCREW LOW PROFILE 3.5X30MM TIS (Screw) ×6 IMPLANT
SET HNDPC FAN SPRY TIP SCT (DISPOSABLE) ×1 IMPLANT
SLING ARM FOAM STRAP LRG (SOFTGOODS) IMPLANT
SLING ARM FOAM STRAP MED (SOFTGOODS) IMPLANT
STRIP CLOSURE SKIN 1/2X4 (GAUZE/BANDAGES/DRESSINGS) ×4 IMPLANT
SUT MNCRL AB 4-0 PS2 18 (SUTURE) ×3 IMPLANT
SUT VIC AB 1 CT1 27 (SUTURE) ×2
SUT VIC AB 1 CT1 27XBRD ANTBC (SUTURE) ×1 IMPLANT
SUT VIC AB 2-0 CT1 27 (SUTURE) ×4
SUT VIC AB 2-0 CT1 TAPERPNT 27 (SUTURE) ×2 IMPLANT
TOWEL OR 17X26 10 PK STRL BLUE (TOWEL DISPOSABLE) ×3 IMPLANT
WATER STERILE IRR 1000ML POUR (IV SOLUTION) ×3 IMPLANT

## 2017-12-12 NOTE — Anesthesia Postprocedure Evaluation (Signed)
Anesthesia Post Note  Patient: [name]  Procedure(s) Performed: OPEN REDUCTION INTERNAL FIXATION  LEFT PROXIMAL HUMERUS FRACTURE (Left Shoulder)     Patient location during evaluation: PACU Anesthesia Type: General Level of consciousness: sedated and patient cooperative Pain management: pain level controlled Vital Signs Assessment: post-procedure vital signs reviewed and stable Respiratory status: spontaneous breathing Cardiovascular status: stable Anesthetic complications: no    Last Vitals:  Vitals:   12/12/17 2055 12/12/17 2155  BP: (!) 153/90 (!) 157/91  Pulse: 89 98  Resp: 15 17  Temp: 36.6 C 36.6 C  SpO2: 99% 98%    Last Pain:  Vitals:   12/12/17 2155  TempSrc: Oral  PainSc:                  [name]

## 2017-12-12 NOTE — Anesthesia Procedure Notes (Signed)
Procedure Name: Intubation Date/Time: 12/12/2017 4:51 PM Performed by: Lissa Morales, CRNA Pre-anesthesia Checklist: Patient identified, Emergency Drugs available, Suction available and Patient being monitored Patient Re-evaluated:Patient Re-evaluated prior to induction Oxygen Delivery Method: Circle system utilized Preoxygenation: Pre-oxygenation with 100% oxygen Induction Type: IV induction Ventilation: Mask ventilation without difficulty Laryngoscope Size: Mac and 3 Grade View: Grade II Tube type: Oral Number of attempts: 1 Airway Equipment and Method: Stylet and Oral airway Placement Confirmation: ETT inserted through vocal cords under direct vision,  positive ETCO2 and breath sounds checked- equal and bilateral Secured at: 21 cm Tube secured with: Tape Dental Injury: Teeth and Oropharynx as per pre-operative assessment

## 2017-12-12 NOTE — Anesthesia Procedure Notes (Signed)
Anesthesia Regional Block: Interscalene brachial plexus block   Pre-Anesthetic Checklist: ,, timeout performed, Correct Patient, Correct Site, Correct Laterality, Correct Procedure, Correct Position, site marked, Risks and benefits discussed,  Surgical consent,  Pre-op evaluation,  At surgeon's request and post-op pain management  Laterality: Left  Prep: chloraprep       Needles:  Injection technique: Single-shot  Needle Type: Stimulator Needle - 40     Needle Length: 4cm  Needle Gauge: 22     Additional Needles:   Procedures:, nerve stimulator,,,,,,,  Narrative:  Start time: 12/12/2017 4:20 PM End time: 12/12/2017 4:24 PM Injection made incrementally with aspirations every 5 mL.  Performed by: Personally  Anesthesiologist: Lewie LoronGermeroth, Morrell Fluke, MD  Additional Notes: BP cuff, EKG monitors applied. Sedation begun. Nerve location verified with U/S. Anesthetic injected incrementally, slowly , and after neg aspirations under direct u/s guidance. Good perineural spread. Tolerated well.
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
## 2017-12-12 NOTE — Brief Op Note (Signed)
12/12/2017  6:37 PM  PATIENT:  Vanessa Shah  52 y.o. female  PRE-OPERATIVE DIAGNOSIS:  comminuted left humeral head and neck fracture  POST-OPERATIVE DIAGNOSIS:  comminuted left humeral head and neck fracture  PROCEDURE:  Procedure(s): OPEN REDUCTION INTERNAL FIXATION  LEFT PROXIMAL HUMERUS FRACTURE (Left)  SURGEON:  Surgeon(s) and Role:    Kathryne Hitch* Kent Riendeau Y, MD - Primary  PHYSICIAN ASSISTANT: Rexene EdisonGil Clark, PA-C  ANESTHESIA:   regional and general  COUNTS:  YES  DICTATION: .Other Dictation: Dictation Number [id number]  PLAN OF CARE: Admit for overnight observation  PATIENT DISPOSITION:  PACU - hemodynamically stable.   Delay start of Pharmacological VTE agent (>24hrs) due to surgical blood loss or risk of bleeding: no

## 2017-12-12 NOTE — Anesthesia Preprocedure Evaluation (Signed)
Anesthesia Evaluation  Patient identified by MRN, date of birth, ID band Patient awake    Reviewed: Allergy & Precautions, NPO status , Patient's Chart, lab work & pertinent test results  Airway Mallampati: II  TM Distance: >3 FB Neck ROM: Full    Dental no notable dental hx.    Pulmonary former smoker,    Pulmonary exam normal breath sounds clear to auscultation       Cardiovascular negative cardio ROS Normal cardiovascular exam Rhythm:Regular Rate:Normal     Neuro/Psych Seizures -,  PSYCHIATRIC DISORDERS Anxiety Depression    GI/Hepatic negative GI ROS, Neg liver ROS,   Endo/Other  negative endocrine ROS  Renal/GU negative Renal ROS     Musculoskeletal negative musculoskeletal ROS (+)   Abdominal   Peds  Hematology negative hematology ROS (+)   Anesthesia Other Findings   Reproductive/Obstetrics negative OB ROS                             Anesthesia Physical Anesthesia Plan  ASA: II  Anesthesia Plan: General   Post-op Pain Management: GA combined w/ Regional for post-op pain   Induction: Intravenous  PONV Risk Score and Plan: 3 and Ondansetron, Dexamethasone and Midazolam  Airway Management Planned: Oral ETT  Additional Equipment:   Intra-op Plan:   Post-operative Plan: Extubation in OR  Informed Consent: I have reviewed the patients History and Physical, chart, labs and discussed the procedure including the risks, benefits and alternatives for the proposed anesthesia with the patient or authorized representative who has indicated his/her understanding and acceptance.   Dental advisory given  Plan Discussed with: CRNA  Anesthesia Plan Comments:         Anesthesia Quick Evaluation

## 2017-12-12 NOTE — Transfer of Care (Signed)
Immediate Anesthesia Transfer of Care Note  Patient: Vanessa Shah  Procedure(s) Performed: OPEN REDUCTION INTERNAL FIXATION  LEFT PROXIMAL HUMERUS FRACTURE (Left Shoulder)  Patient Location: PACU  Anesthesia Type:General  Level of Consciousness: awake, alert , oriented and patient cooperative  Airway & Oxygen Therapy: Patient Spontanous Breathing and Patient connected to face mask oxygen  Post-op Assessment: Report given to RN, Post -op Vital signs reviewed and stable and Patient moving all extremities X 4  Post vital signs: stable  Last Vitals:  Vitals:   12/12/17 1852 12/12/17 1853  BP: (!) 154/93 (!) 145/93  Pulse: 91 94  Resp:  18  Temp:  (P) 36.9 C  SpO2: 100% 100%    Last Pain:  Vitals:   12/12/17 1601  TempSrc: Oral  PainSc:       Patients Stated Pain Goal: 4 (12/12/17 1558)  Complications: No apparent anesthesia complications

## 2017-12-12 NOTE — Plan of Care (Signed)
initiated

## 2017-12-13 DIAGNOSIS — S42232A 3-part fracture of surgical neck of left humerus, initial encounter for closed fracture: Secondary | ICD-10-CM | POA: Diagnosis not present

## 2017-12-13 MED ORDER — OXYCODONE HCL 5 MG PO TABS: 5.0000 mg | ORAL_TABLET | ORAL | 0 refills | Status: DC | PRN

## 2017-12-13 NOTE — Progress Notes (Signed)
[facility] Pt received to room 1610 via stretcher and transferred to bed without difficulty. Education initiated and use of call bell reviewed With pt.

## 2017-12-13 NOTE — Progress Notes (Signed)
Subjective: 1 Day Post-Op Procedure(s) (LRB): OPEN REDUCTION INTERNAL FIXATION  LEFT PROXIMAL HUMERUS FRACTURE (Left) Patient reports pain as mild.    Objective: Vital signs in last 24 hours: Temp:  [97.5 F (36.4 C)-98.6 F (37 C)] 97.7 F (36.5 C) (02/02 1012) Pulse Rate:  [79-98] 87 (02/02 1012) Resp:  [11-19] 16 (02/02 1012) BP: (124-167)/(80-99) 127/80 (02/02 1012) SpO2:  [98 %-100 %] 98 % (02/02 1012) Weight:  [168 lb (76.2 kg)] 168 lb (76.2 kg) (02/01 2030)  Intake/Output from previous day: 02/01 0701 - 02/02 0700 In: 3430 [P.O.:480; I.V.:2900; IV Piggyback:50] Out: 25 [Blood:25] Intake/Output this shift: Total I/O In: 480 [P.O.:480] Out: -   Recent Labs    12/10/17 1149  HGB 11.6*   Recent Labs    12/10/17 1149  WBC 8.3  RBC 3.84*  HCT 35.1*  PLT 237   Recent Labs    12/10/17 1140  NA 138  K 4.4  CL 106  CO2 27  BUN 11  CREATININE 0.64  GLUCOSE 93  CALCIUM 9.0   No results for input(s): LABPT, INR in the last 72 hours.  Intact pulses distally Incision: dressing C/D/I Some left hand numbness  Assessment/Plan: 1 Day Post-Op Procedure(s) (LRB): OPEN REDUCTION INTERNAL FIXATION  LEFT PROXIMAL HUMERUS FRACTURE (Left) Can discharge to home today.  Vanessa Shah 12/13/2017, 10:25 AM
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
## 2017-12-13 NOTE — Op Note (Deleted)
  The note originally documented on this encounter has been moved the the encounter in which it belongs.  

## 2017-12-13 NOTE — Evaluation (Addendum)
Occupational Therapy Evaluation Patient Details Name: Vanessa DurhamChristy Shah MRN: [id number] DOB: 1966-03-04 Today's Date: 12/13/2017    History of Present Illness 52 y.o. s/p Lt proximal humerus fracture. PMH includes anxiety, depression, DVT, seizures.    Clinical Impression   Pt s/p above. Pt independent with ADLs, PTA. Education provided in session to pt and her aunt. OT signing off.    Follow Up Recommendations  No OT follow up;Supervision - Intermittent    Equipment Recommendations  None recommended by OT    Recommendations for Other Services       Precautions / Restrictions Precautions Precautions: Shoulder Type of Shoulder Precautions: spoke with Dr. Morrell RiddleBlackman-no shoulder abduction past 90 degrees and no left forward flexion past 90 degrees, elbow/wrist/hand ROM okay. Shoulder Interventions: Shoulder sling/immobilizer Precaution Booklet Issued: Yes (comment) Precaution Comments: educated on shoulder precautions and gave handout Required Braces or Orthoses: Sling Restrictions Weight Bearing Restrictions: Yes LUE Weight Bearing: Non weight bearing      Mobility Bed Mobility Overal bed mobility: Needs Assistance Bed Mobility: Supine to Sit     Supine to sit: Supervision        Transfers Overall transfer level: Needs assistance   Transfers: Sit to/from Stand Sit to Stand: Min guard              Balance    A little unsteady with single leg stance while simulating functional activity-LB bathing.                                       ADL either performed or assessed with clinical judgement   ADL Overall ADL's : Needs assistance/impaired             Lower Body Bathing: Minimal assistance;Sit to/from stand       Lower Body Dressing: Minimal assistance;Sit to/from stand   Toilet Transfer: Min guard;Ambulation(sit to stand from bed)           Functional mobility during ADLs: Min guard General ADL Comments: Educated on shoulder  sling and dressing/hygiene for left UE.  Discussed what pt could use for shower chair.     Vision         Perception     Praxis      Pertinent Vitals/Pain Pain Assessment: 0-10 Pain Score: (4-5) Pain Location: Lt shoulder/upper arm Pain Descriptors / Indicators: Constant;Aching;Sharp Pain Intervention(s): Monitored during session     Hand Dominance Right   Extremity/Trunk Assessment Upper Extremity Assessment Upper Extremity Assessment: LUE deficits/detail LUE Deficits / Details: swelling in hand LUE Coordination: decreased fine motor   Lower Extremity Assessment Lower Extremity Assessment: Overall WFL for tasks assessed       Communication Communication Communication: No difficulties   Cognition Arousal/Alertness: Awake/alert Behavior During Therapy: WFL for tasks assessed/performed Overall Cognitive Status: Within Functional Limits for tasks assessed                                     General Comments       Exercises Exercises: Other exercises Other Exercises Other Exercises: educated on elbow, wrist, hand exercises.   Shoulder Instructions      Home Living Family/patient expects to be discharged to:: Private residence Living Arrangements: Alone Available Help at Discharge: Family;Available 24 hours/day  Prior Functioning/Environment Level of Independence: Independent                 OT Problem List: Decreased strength;Decreased range of motion;Increased edema;Pain;Impaired UE functional use;Decreased activity tolerance;Decreased knowledge of precautions;Impaired balance (sitting and/or standing)      OT Treatment/Interventions:      OT Goals(Current goals can be found in the care plan section)    OT Frequency:     Barriers to D/C:            Co-evaluation              AM-PAC PT "6 Clicks" Daily Activity     Outcome Measure Help from another person eating meals?:  A Little Help from another person taking care of personal grooming?: A Little Help from another person toileting, which includes using toliet, bedpan, or urinal?: A Little Help from another person bathing (including washing, rinsing, drying)?: A Little Help from another person to put on and taking off regular upper body clothing?: A Lot Help from another person to put on and taking off regular lower body clothing?: A Little 6 Click Score: 17   End of Session Equipment Utilized During Treatment: Other (comment)(sling)  Activity Tolerance: Patient tolerated treatment well Patient left: in bed;with family/visitor present  OT Visit Diagnosis: Pain Pain - Right/Left: Left Pain - part of body: Arm;Shoulder                Time: 1096-0454 OT Time Calculation (min): 18 min Charges:  OT General Charges $OT Visit: 1 Visit OT Evaluation $OT Eval Moderate Complexity: 1 Mod G-Codes: OT G-codes **NOT FOR INPATIENT CLASS** Functional Assessment Tool Used: Clinical judgement Functional Limitation: Self care Self Care Current Status (U9811): At least 40 percent but less than 60 percent impaired, limited or restricted Self Care Goal Status (B1478): At least 40 percent but less than 60 percent impaired, limited or restricted Self Care Discharge Status [phone]): At least 40 percent but less than 60 percent impaired, limited or restricted   Earlie Raveling OTR/L 12/13/2017, 2:06 PM

## 2017-12-13 NOTE — Op Note (Signed)
[name], [name]             ACCOUNT NO.:  [id number]  MEDICAL RECORD NO.:  [id number]  LOCATION:  WA15                         FACILITY:  [facility]  PHYSICIAN:  Vanessa Shah. Vanessa Shah, M.D.DATE OF BIRTH:  18-Sep-1966  DATE OF PROCEDURE:  12/12/2017 DATE OF DISCHARGE:                              OPERATIVE REPORT   PREOPERATIVE DIAGNOSIS:  Left 2-part proximal humerus fracture.  POSTOPERATIVE DIAGNOSIS:  Left 2-part proximal humerus fracture.  PROCEDURE:  Open reduction and internal fixation of left 2-part proximal humerus fracture.  IMPLANTS:  Biomet ALPS 3-hole left proximal humerus locking plate and screws.  SURGEON:  Vanessa Shah. Vanessa Shah, M.D.  ASSISTANT:  Vanessa Canal, PA-C.  ANESTHESIA: 1. Right shoulder regional block. 2. General.  BLOOD LOSS:  Less than 100 cc.  ANTIBIOTICS:  900 mg of IV clindamycin.  COMPLICATIONS:  None.  INDICATIONS:  Ms. Vanessa Shah is a 52 year old, right-hand dominant female, who sustained a mechanical fall accidentally last week injuring her left shoulder.  She was found to have a 2-part proximal humerus fracture. Given her young age and good bone quality combined with the pain she was having, we recommended open reduction and internal fixation with plate and screws.  We showed her a model of her shoulder in the office and went over x-rays in detail and explained the risks and benefits of the surgery.  Given the pain she was having, she does wish to proceed.  PROCEDURE DESCRIPTION:  After informed consent was obtained, appropriate left shoulder was marked.  She was brought to the operating room and placed supine on the operating table.  Of note, in the holding room, Anesthesia did obtain regional block.  When she was placed supine on the operating table, general anesthesia was then obtained.  She was then fashioned in a beach-chair position with appropriate positioning of the head, neck, waist, knees and down on operative right arm.   Her left operative shoulder was prepped and draped with DuraPrep and sterile drapes.  A time-out was called, she was identified as correct patient and correct left shoulder.  I then made an incision using the deltopectoral approach to the shoulder, the proximal humerus on the left side.  We dissected down to the deltoid muscle and then slowly, meticulously divided the deltoid muscle to expose the proximal humerus fracture.  The periosteum was likely intact.  Under direct fluoroscopic guidance, we were able to reduce the fracture as anatomically as possible.  We then chose our Biomet 3-hole left proximal humeral locking plate and secured this to the proximal humerus watching under direct visualization and fluoroscopy.  We placed temporary K-wires first.  We then brought the plate to the humeral shaft with bicortical screws, both locking and nonlocking.  We then placed the series of locking pegs into the humeral head.  I then put the shoulder through internal and external rotation, and got good x-ray views and then did move as a unit under direct fluoroscopy and visualization as well.  Once we were pleased with the reduction, we irrigated the soft tissue with normal saline solution. We closed the deltopectoral interval with running 0 Vicryl suture followed by 0 Vicryl in the deep tissue, 2-0 Vicryl in the subcutaneous  tissue, 4-0 Monocryl subcuticular stitch and Steri-Strips on the skin. An Aquacel dressing was applied.  Her shoulder was placed in a sling. She was awakened, extubated and taken to the recovery room in stable condition.  All final counts were correct.  There were no complications noted.  Of note, Vanessa CanalGilbert Clark, PA-C, assisted in the entire case, his assistance was crucial for facilitating all aspects of this case.     Vanessa Pandahristopher Y. Vanessa IvanBlackman, M.D.     CYB/MEDQ  D:  12/12/2017  T:  12/13/2017  Job:  [id number]

## 2017-12-13 NOTE — Progress Notes (Signed)
Discharged from floor via w/c for transport home by car. Family & belongings with pt. No changes in assessment. Kuzey Ogata  

## 2017-12-13 NOTE — Discharge Instructions (Signed)
You can get your dressing wet in the shower daily. The dressing can stay on until your follow-up appointment. No heavy lifting with your left arm. Come out of your sling daily as comfort allows to bend your elbow and wrist.

## 2017-12-15 ENCOUNTER — Encounter (HOSPITAL_COMMUNITY): Payer: Self-pay | Admitting: Orthopaedic Surgery

## 2017-12-22 ENCOUNTER — Ambulatory Visit (INDEPENDENT_AMBULATORY_CARE_PROVIDER_SITE_OTHER): Payer: BLUE CROSS/BLUE SHIELD

## 2017-12-22 ENCOUNTER — Ambulatory Visit (INDEPENDENT_AMBULATORY_CARE_PROVIDER_SITE_OTHER): Payer: BLUE CROSS/BLUE SHIELD | Admitting: Orthopaedic Surgery

## 2017-12-22 ENCOUNTER — Encounter (INDEPENDENT_AMBULATORY_CARE_PROVIDER_SITE_OTHER): Payer: Self-pay | Admitting: Orthopaedic Surgery

## 2017-12-22 DIAGNOSIS — Z967 Presence of other bone and tendon implants: Secondary | ICD-10-CM | POA: Diagnosis not present

## 2017-12-22 DIAGNOSIS — Z8781 Personal history of (healed) traumatic fracture: Secondary | ICD-10-CM | POA: Diagnosis not present

## 2017-12-22 DIAGNOSIS — Z9889 Other specified postprocedural states: Secondary | ICD-10-CM

## 2017-12-22 MED ORDER — OXYCODONE HCL 5 MG PO TABS: 5.0000 mg | ORAL_TABLET | Freq: Four times a day (QID) | ORAL | 0 refills | Status: DC | PRN

## 2017-12-22 NOTE — Progress Notes (Signed)
The patient is 2 weeks status post open reduction and fixation of a left proximal humerus fracture.  She is been in a sling and has been having some hand swelling.  She is doing well otherwise.  On exam the suture line looks good and new Steri-Strips have been applied.  There is no sutures are removed.  There is no evidence of infection.  Her shoulder is clinically well located but has limited range of motion.  She is able to move her wrist fingers and thumb.  Her elbow is sore.  2 views of the left proximal humerus show intact hardware from surgical fixation of this fracture with no comp gating features of a proximal humerus fracture.  This point I want her to work on range of motion of her shoulder and coming out of her sling as much as she can comfort wise.  I did refill her pain medication.  We will see her back in 4 weeks with at that will be 4 views of her left shoulder which would be a fully internally and externally rotated views a single AP view as well as an axillary view.

## 2018-01-19 ENCOUNTER — Encounter (INDEPENDENT_AMBULATORY_CARE_PROVIDER_SITE_OTHER): Payer: Self-pay | Admitting: Orthopaedic Surgery

## 2018-01-19 ENCOUNTER — Ambulatory Visit (INDEPENDENT_AMBULATORY_CARE_PROVIDER_SITE_OTHER): Payer: BLUE CROSS/BLUE SHIELD

## 2018-01-19 ENCOUNTER — Ambulatory Visit (INDEPENDENT_AMBULATORY_CARE_PROVIDER_SITE_OTHER): Payer: BLUE CROSS/BLUE SHIELD | Admitting: Orthopaedic Surgery

## 2018-01-19 DIAGNOSIS — Z967 Presence of other bone and tendon implants: Secondary | ICD-10-CM | POA: Diagnosis not present

## 2018-01-19 DIAGNOSIS — Z9889 Other specified postprocedural states: Secondary | ICD-10-CM

## 2018-01-19 DIAGNOSIS — Z8781 Personal history of (healed) traumatic fracture: Secondary | ICD-10-CM | POA: Diagnosis not present

## 2018-01-19 NOTE — Progress Notes (Signed)
The patient is now just over a month status post open reduction and fixation of a complex left proximal humerus fracture.  She is been trying to work on elbow motion as well as shoulder motion but is having some difficulty.  I did review x-rays of her elbow at the time of injury and denies any fracture.  On exam her incisions well-healed over the left shoulder.  Her range of motion is certainly limited in terms of mobility of the shoulder but the axillary nerve seems to be functioning correctly.  The shoulder is clinically well located.  The elbow is still lacks full extension by a few degrees and she has problems with full supination.  At this point x-rays were obtained of the shoulder and it shows fracture is healing nicely and the hardware is intact.  I would like to send her to outpatient physical [facility] to work on range of motion of her left shoulder as well as her left elbow.  I will see her back in 6 weeks and I would like a single AP view of the left shoulder and 2 views of the left elbow.

## 2018-03-09 ENCOUNTER — Ambulatory Visit (INDEPENDENT_AMBULATORY_CARE_PROVIDER_SITE_OTHER): Payer: Self-pay

## 2018-03-09 ENCOUNTER — Ambulatory Visit (INDEPENDENT_AMBULATORY_CARE_PROVIDER_SITE_OTHER): Payer: BLUE CROSS/BLUE SHIELD | Admitting: Orthopaedic Surgery

## 2018-03-09 ENCOUNTER — Encounter (INDEPENDENT_AMBULATORY_CARE_PROVIDER_SITE_OTHER): Payer: Self-pay | Admitting: Orthopaedic Surgery

## 2018-03-09 DIAGNOSIS — Z967 Presence of other bone and tendon implants: Secondary | ICD-10-CM

## 2018-03-09 DIAGNOSIS — Z9889 Other specified postprocedural states: Secondary | ICD-10-CM

## 2018-03-09 DIAGNOSIS — Z8781 Personal history of (healed) traumatic fracture: Secondary | ICD-10-CM

## 2018-03-09 NOTE — Progress Notes (Signed)
The patient is now almost 3 months status post open reduction internal fixation of a 2 part proximal humerus fracture.  She is going through extensive physical therapy.  She says they have worked on dry needling.  On exam she is improving in terms of her internal and external rotation however her abduction still limited where she is using more of her deltoids to abduct her shoulder.  Her internal rotation with adduction when she reaches behind her still to the lower to mid lumbar spine.  X-rays of the shoulder show the fracture is healed.  X-rays of the elbow show no acute findings.  At this point she will continue extensive physical therapy on her left shoulder.  I would like to see her back in a month but no x-rays are needed.  At that visit I will likely provide a steroid injection subacromial space and potentially set her up for an intra-articular glenohumeral injection if warranted.

## 2018-04-08 ENCOUNTER — Ambulatory Visit (INDEPENDENT_AMBULATORY_CARE_PROVIDER_SITE_OTHER): Payer: BLUE CROSS/BLUE SHIELD | Admitting: Orthopaedic Surgery

## 2018-04-08 ENCOUNTER — Encounter (INDEPENDENT_AMBULATORY_CARE_PROVIDER_SITE_OTHER): Payer: Self-pay | Admitting: Orthopaedic Surgery

## 2018-04-08 DIAGNOSIS — Z967 Presence of other bone and tendon implants: Secondary | ICD-10-CM

## 2018-04-08 DIAGNOSIS — M25512 Pain in left shoulder: Secondary | ICD-10-CM | POA: Diagnosis not present

## 2018-04-08 DIAGNOSIS — Z8781 Personal history of (healed) traumatic fracture: Secondary | ICD-10-CM

## 2018-04-08 DIAGNOSIS — G8929 Other chronic pain: Secondary | ICD-10-CM | POA: Diagnosis not present

## 2018-04-08 DIAGNOSIS — Z9889 Other specified postprocedural states: Secondary | ICD-10-CM

## 2018-04-08 MED ORDER — METHYLPREDNISOLONE ACETATE 40 MG/ML IJ SUSP
40.0000 mg | INTRAMUSCULAR | Status: AC | PRN
  Administered 2018-04-08: 40 mg via INTRA_ARTICULAR

## 2018-04-08 MED ORDER — LIDOCAINE HCL 1 % IJ SOLN
3.0000 mL | INTRAMUSCULAR | Status: AC | PRN
  Administered 2018-04-08: 3 mL
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
## 2018-04-10 ENCOUNTER — Other Ambulatory Visit (INDEPENDENT_AMBULATORY_CARE_PROVIDER_SITE_OTHER): Payer: Self-pay

## 2018-04-10 DIAGNOSIS — M25512 Pain in left shoulder: Principal | ICD-10-CM

## 2018-04-10 DIAGNOSIS — G8929 Other chronic pain: Secondary | ICD-10-CM

## 2018-04-16 ENCOUNTER — Encounter: Payer: Self-pay | Admitting: Internal Medicine

## 2018-05-05 ENCOUNTER — Ambulatory Visit (INDEPENDENT_AMBULATORY_CARE_PROVIDER_SITE_OTHER): Payer: BLUE CROSS/BLUE SHIELD | Admitting: Physical Medicine and Rehabilitation

## 2018-05-05 ENCOUNTER — Encounter (INDEPENDENT_AMBULATORY_CARE_PROVIDER_SITE_OTHER): Payer: Self-pay | Admitting: Physical Medicine and Rehabilitation

## 2018-05-05 ENCOUNTER — Ambulatory Visit (INDEPENDENT_AMBULATORY_CARE_PROVIDER_SITE_OTHER): Payer: Self-pay

## 2018-05-05 DIAGNOSIS — M25512 Pain in left shoulder: Secondary | ICD-10-CM | POA: Diagnosis not present

## 2018-05-05 DIAGNOSIS — G8929 Other chronic pain: Secondary | ICD-10-CM
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
## 2018-05-05 NOTE — Progress Notes (Signed)
  Numeric Pain Rating Scale and Functional Assessment Average Pain 4   In the last MONTH (on 0-10 scale) has pain interfered with the following?  1. General activity like being  able to carry out your everyday physical activities such as walking, climbing stairs, carrying groceries, or moving a chair?  Rating(2)   +Driver, -BT, -Dye Allergies. 

## 2018-05-06 ENCOUNTER — Ambulatory Visit (INDEPENDENT_AMBULATORY_CARE_PROVIDER_SITE_OTHER): Payer: BLUE CROSS/BLUE SHIELD | Admitting: Orthopaedic Surgery

## 2018-05-06 MED ORDER — TRIAMCINOLONE ACETONIDE 40 MG/ML IJ SUSP
80.0000 mg | INTRAMUSCULAR | Status: AC | PRN
  Administered 2018-05-05: 80 mg via INTRA_ARTICULAR

## 2018-05-06 MED ORDER — BUPIVACAINE HCL 0.5 % IJ SOLN
3.0000 mL | INTRAMUSCULAR | Status: AC | PRN
  Administered 2018-05-05: 3 mL via INTRA_ARTICULAR

## 2018-05-25 ENCOUNTER — Ambulatory Visit (INDEPENDENT_AMBULATORY_CARE_PROVIDER_SITE_OTHER): Payer: BLUE CROSS/BLUE SHIELD | Admitting: Orthopaedic Surgery

## 2018-05-25 ENCOUNTER — Encounter (INDEPENDENT_AMBULATORY_CARE_PROVIDER_SITE_OTHER): Payer: Self-pay | Admitting: Orthopaedic Surgery

## 2018-05-25 DIAGNOSIS — Z9889 Other specified postprocedural states: Secondary | ICD-10-CM

## 2018-05-25 DIAGNOSIS — Z967 Presence of other bone and tendon implants: Secondary | ICD-10-CM

## 2018-05-25 DIAGNOSIS — Z8781 Personal history of (healed) traumatic fracture: Secondary | ICD-10-CM

## 2018-05-25 NOTE — Progress Notes (Signed)
The patient is now 5 months out from open reduction and fixation of a complex left proximal humerus fracture.  I did have Dr. Alvester MorinNewton provide an intra-articular injection under direct fluoroscopy in her left shoulder joint last month.  She says she is noticed improvement in range of motion with that shoulder and is reporting decreased pain.  On exam, she shows much less arthrofibrosis with the shoulder.  Her her abduction and overhead motion as well as external rotation of improved.  Her internal rotation with adduction is also improved.  At this point I do feel she is making excellent progress.  I would like to see her back for final visit in about 3 months.  At that visit I would like an AP and axillary view of the left shoulder for 2 views of the left shoulder.  All question concerns were answered and addressed.

## 2018-06-22 ENCOUNTER — Ambulatory Visit (AMBULATORY_SURGERY_CENTER): Payer: Self-pay | Admitting: *Deleted

## 2018-06-22 VITALS — Ht 69.0 in | Wt 166.0 lb

## 2018-06-22 DIAGNOSIS — Z1211 Encounter for screening for malignant neoplasm of colon: Secondary | ICD-10-CM

## 2018-06-22 MED ORDER — NA SULFATE-K SULFATE-MG SULF 17.5-3.13-1.6 GM/177ML PO SOLN: ORAL | 0 refills | Status: DC

## 2018-06-22 NOTE — Progress Notes (Signed)
Patient denies any allergies to eggs or soy. Patient denies any problems with anesthesia/sedation. Patient denies any oxygen use at home. Patient denies taking any diet/weight loss medications or blood thinners. EMMI education assisgned to patient on colonoscopy, this was explained and instructions given to patient. suprep sample given to pt.

## 2018-06-25 ENCOUNTER — Encounter: Payer: Self-pay | Admitting: Internal Medicine

## 2018-07-06 ENCOUNTER — Encounter: Payer: Self-pay | Admitting: Internal Medicine

## 2018-07-06 ENCOUNTER — Ambulatory Visit (AMBULATORY_SURGERY_CENTER): Payer: BLUE CROSS/BLUE SHIELD | Admitting: Internal Medicine

## 2018-07-06 VITALS — BP 115/68 | HR 63 | Temp 97.5°F | Resp 14 | Ht 69.0 in | Wt 165.0 lb

## 2018-07-06 DIAGNOSIS — Z1211 Encounter for screening for malignant neoplasm of colon: Secondary | ICD-10-CM

## 2018-07-06 MED ORDER — SODIUM CHLORIDE 0.9 % IV SOLN: 500.0000 mL | Freq: Once | INTRAVENOUS | Status: DC

## 2018-07-06 NOTE — Progress Notes (Signed)
Report given to PACU, vss 

## 2018-07-06 NOTE — Patient Instructions (Signed)
Please read handout on Diverticulosis. Continue present medications.     YOU HAD AN ENDOSCOPIC PROCEDURE TODAY AT THE [facility]:   Refer to the procedure report that was given to you for any specific questions about what was found during the examination.  If the procedure report does not answer your questions, please call your gastroenterologist to clarify.  If you requested that your care partner not be given the details of your procedure findings, then the procedure report has been included in a sealed envelope for you to review at your convenience later.  YOU SHOULD EXPECT: Some feelings of bloating in the abdomen. Passage of more gas than usual.  Walking can help get rid of the air that was put into your GI tract during the procedure and reduce the bloating. If you had a lower endoscopy (such as a colonoscopy or flexible sigmoidoscopy) you may notice spotting of blood in your stool or on the toilet paper. If you underwent a bowel prep for your procedure, you may not have a normal bowel movement for a few days.  Please Note:  You might notice some irritation and congestion in your nose or some drainage.  This is from the oxygen used during your procedure.  There is no need for concern and it should clear up in a day or so.  SYMPTOMS TO REPORT IMMEDIATELY:   Following lower endoscopy (colonoscopy or flexible sigmoidoscopy):  Excessive amounts of blood in the stool  Significant tenderness or worsening of abdominal pains  Swelling of the abdomen that is new, acute  Fever of 100F or higher   For urgent or emergent issues, a gastroenterologist can be reached at any hour by calling [phone].   DIET:  We do recommend a small meal at first, but then you may proceed to your regular diet.  Drink plenty of fluids but you should avoid alcoholic beverages for 24 hours.  ACTIVITY:  You should plan to take it easy for the rest of today and you should NOT DRIVE or use heavy  machinery until tomorrow (because of the sedation medicines used during the test).    FOLLOW UP: Our staff will call the number listed on your records the next business day following your procedure to check on you and address any questions or concerns that you may have regarding the information given to you following your procedure. If we do not reach you, we will leave a message.  However, if you are feeling well and you are not experiencing any problems, there is no need to return our call.  We will assume that you have returned to your regular daily activities without incident.  If any biopsies were taken you will be contacted by phone or by letter within the next 1-3 weeks.  Please call us at [phone] if you have not heard about the biopsies in 3 weeks.    SIGNATURES/CONFIDENTIALITY: You and/or your care partner have signed paperwork which will be entered into your electronic medical record.  These signatures attest to the fact that that the information above on your After Visit Summary has been reviewed and is understood.  Full responsibility of the confidentiality of this discharge information lies with you and/or your care-partner.

## 2018-07-06 NOTE — Progress Notes (Signed)
Pt's states no medical or surgical changes since previsit or office visit. 

## 2018-07-06 NOTE — Op Note (Signed)
[facility] Patient Name: Vanessa Shah Procedure Date: 07/06/2018 9:35 AM MRN: [id number] Endoscopist: Beverley Fiedler , MD Age: 52 Referring MD:  Date of Birth: 25-May-1966 Gender: Female Account #: [id number] Procedure:                Colonoscopy Indications:              Screening for colorectal malignant neoplasm, This                            is the patient's first colonoscopy Medicines:                Monitored Anesthesia Care Procedure:                Pre-Anesthesia Assessment:                           - Prior to the procedure, a History and Physical                            was performed, and patient medications and                            allergies were reviewed. The patient's tolerance of                            previous anesthesia was also reviewed. The risks                            and benefits of the procedure and the sedation                            options and risks were discussed with the patient.                            All questions were answered, and informed consent                            was obtained. Prior Anticoagulants: The patient has                            taken no previous anticoagulant or antiplatelet                            agents. ASA Grade Assessment: II - A patient with                            mild systemic disease. After reviewing the risks                            and benefits, the patient was deemed in                            satisfactory condition to undergo the procedure.  After obtaining informed consent, the colonoscope                            was passed under direct vision. Throughout the                            procedure, the patient's blood pressure, pulse, and                            oxygen saturations were monitored continuously. The                            Colonoscope was introduced through the anus and                            advanced to the cecum,  identified by appendiceal                            orifice and ileocecal valve. The colonoscopy was                            performed without difficulty. The patient tolerated                            the procedure well. The quality of the bowel                            preparation was excellent with Suprep. The                            ileocecal valve, appendiceal orifice, and rectum                            were photographed. Scope In: 9:48:10 AM Scope Out: 9:58:42 AM Scope Withdrawal Time: 0 hours 8 minutes 8 seconds  Total Procedure Duration: 0 hours 10 minutes 32 seconds  Findings:                 A few small-mouthed diverticula were found in the                            sigmoid colon.                           The digital rectal exam was normal.                           The exam was otherwise without abnormality on                            direct and retroflexion views. Complications:            No immediate complications. Estimated Blood Loss:     Estimated blood loss: none. Impression:               - Diverticulosis in the sigmoid colon.                           -  The examination was otherwise normal on direct                            and retroflexion views.                           - No specimens collected. Recommendation:           - Patient has a contact number available for                            emergencies. The signs and symptoms of potential                            delayed complications were discussed with the                            patient. Return to normal activities tomorrow.                            Written discharge instructions were provided to the                            patient.                           - Resume previous diet.                           - Continue present medications.                           - Repeat colonoscopy in 10 years for screening                            purposes. Beverley Fiedler, MD 07/06/2018  10:02:30 AM This report has been signed electronically.

## 2018-07-07 ENCOUNTER — Telehealth: Payer: Self-pay

## 2018-07-07 DIAGNOSIS — K5790 Diverticulosis of intestine, part unspecified, without perforation or abscess without bleeding: Secondary | ICD-10-CM | POA: Insufficient documentation

## 2018-07-07 NOTE — Telephone Encounter (Signed)
Left message

## 2018-07-07 NOTE — Telephone Encounter (Deleted)
Left message  Follow up Call-  Call back number 07/06/2018  Post procedure Call Back phone  # [phone]  Permission to leave phone message Yes  Some recent data might be hidden     Patient questions:  Do you have a fever, pain , or abdominal swelling? {yes no:314532} Pain Score  {NUMBERS; 0-10:5044} *  Have you tolerated food without any problems? {yes no:314532}  Have you been able to return to your normal activities? {yes no:314532}  Do you have any questions about your discharge instructions: Diet   {yes no:314532} Medications  {yes no:314532} Follow up visit  {yes no:314532}  Do you have questions or concerns about your Care? {yes no:314532}  Actions: * If pain score is 4 or above: {ACTION; [facility] ENDO PAIN >4:21563::"No action needed, pain <4."}

## 2018-08-25 ENCOUNTER — Encounter (INDEPENDENT_AMBULATORY_CARE_PROVIDER_SITE_OTHER): Payer: Self-pay | Admitting: Orthopaedic Surgery

## 2018-08-25 ENCOUNTER — Ambulatory Visit (INDEPENDENT_AMBULATORY_CARE_PROVIDER_SITE_OTHER): Payer: BLUE CROSS/BLUE SHIELD | Admitting: Orthopaedic Surgery

## 2018-08-25 ENCOUNTER — Ambulatory Visit (INDEPENDENT_AMBULATORY_CARE_PROVIDER_SITE_OTHER): Payer: Self-pay

## 2018-08-25 VITALS — Ht 69.0 in | Wt 165.0 lb

## 2018-08-25 DIAGNOSIS — G8929 Other chronic pain: Secondary | ICD-10-CM | POA: Diagnosis not present

## 2018-08-25 DIAGNOSIS — Z9889 Other specified postprocedural states: Secondary | ICD-10-CM

## 2018-08-25 DIAGNOSIS — Z8781 Personal history of (healed) traumatic fracture: Secondary | ICD-10-CM

## 2018-08-25 DIAGNOSIS — M25512 Pain in left shoulder: Secondary | ICD-10-CM | POA: Diagnosis not present

## 2018-08-25 NOTE — Progress Notes (Signed)
The patient is 8 months status post open reduction and fixation of a complex proximal humerus fracture right shoulder.  She is doing well overall.  She does get muscle aching in the shoulder.  She is actually preparing for marathon that she is in a running next week.  I have been concerned about postoperative arthrofibrosis.  On exam her motion is almost entirely full at this point with just slight limitations in her full internal rotation with adduction and her full abduction but her strength is full and there is no deficits in the shoulder on exam in terms of weakness.  The shoulder is clinically well located.  3 views of her left shoulder obtained and show the fracture is healed completely.  The shoulder is well located.  There is no complicating features of the hardware.  There is no evidence of osteonecrosis of the humeral head.  At this point she is doing so well follow-up will be as needed.  All question concerns were answered and addressed.

## 2018-10-22 ENCOUNTER — Other Ambulatory Visit: Payer: Self-pay | Admitting: Obstetrics & Gynecology

## 2018-10-22 DIAGNOSIS — Z1231 Encounter for screening mammogram for malignant neoplasm of breast: Secondary | ICD-10-CM

## 2018-11-25 ENCOUNTER — Ambulatory Visit
Admission: RE | Admit: 2018-11-25 | Discharge: 2018-11-25 | Disposition: A | Discharge status: Home / Self Care | Payer: BLUE CROSS/BLUE SHIELD | Source: Ambulatory Visit | Attending: Obstetrics & Gynecology | Admitting: Obstetrics & Gynecology

## 2018-11-25 DIAGNOSIS — Z1231 Encounter for screening mammogram for malignant neoplasm of breast: Secondary | ICD-10-CM

## 2019-10-12 ENCOUNTER — Other Ambulatory Visit: Payer: Self-pay | Admitting: Obstetrics & Gynecology

## 2019-10-12 DIAGNOSIS — Z1231 Encounter for screening mammogram for malignant neoplasm of breast: Secondary | ICD-10-CM

## 2019-12-02 ENCOUNTER — Other Ambulatory Visit: Payer: Self-pay

## 2019-12-02 ENCOUNTER — Ambulatory Visit
Admission: RE | Admit: 2019-12-02 | Discharge: 2019-12-02 | Disposition: A | Discharge status: Home / Self Care | Payer: BC Managed Care – PPO | Source: Ambulatory Visit | Attending: Obstetrics & Gynecology | Admitting: Obstetrics & Gynecology

## 2019-12-02 DIAGNOSIS — Z1231 Encounter for screening mammogram for malignant neoplasm of breast: Secondary | ICD-10-CM

## 2020-04-12 ENCOUNTER — Encounter: Payer: Self-pay | Admitting: Rehabilitative and Restorative Service Providers"

## 2020-04-12 ENCOUNTER — Ambulatory Visit: Payer: BC Managed Care – PPO | Admitting: Rehabilitative and Restorative Service Providers"

## 2020-04-12 ENCOUNTER — Other Ambulatory Visit: Payer: Self-pay

## 2020-04-12 DIAGNOSIS — M542 Cervicalgia: Secondary | ICD-10-CM | POA: Diagnosis not present

## 2020-04-12 DIAGNOSIS — R293 Abnormal posture: Secondary | ICD-10-CM | POA: Diagnosis not present

## 2020-04-12 NOTE — Patient Instructions (Signed)
Access Code: [email or web address] URL: [email or web address] Date: 04/12/2020 Prepared by: Chyrel Masson  Exercises Seated Gentle Upper Trapezius Stretch - 2 x daily - 7 x weekly - 1 sets - 5 reps - 15 hold Seated Thoracic Lumbar Extension - 2 x daily - 7 x weekly - 3 sets - 10 reps Standing Shoulder Row with Anchored Resistance - 2 x daily - 7 x weekly - 10 reps - 3 sets Shoulder Extension with Resistance - 2 x daily - 7 x weekly - 10 reps - 3 sets
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
## 2020-04-19 ENCOUNTER — Ambulatory Visit: Payer: BC Managed Care – PPO | Admitting: Physical Therapy

## 2020-04-19 ENCOUNTER — Encounter: Payer: Self-pay | Admitting: Physical Therapy

## 2020-04-19 ENCOUNTER — Other Ambulatory Visit: Payer: Self-pay

## 2020-04-19 DIAGNOSIS — R293 Abnormal posture: Secondary | ICD-10-CM | POA: Diagnosis not present

## 2020-04-19 DIAGNOSIS — M542 Cervicalgia: Secondary | ICD-10-CM | POA: Diagnosis not present
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
## 2020-04-26 ENCOUNTER — Encounter: Payer: BC Managed Care – PPO | Admitting: Rehabilitative and Restorative Service Providers"

## 2020-04-28 ENCOUNTER — Encounter: Payer: Self-pay | Admitting: Rehabilitative and Restorative Service Providers"

## 2020-04-28 ENCOUNTER — Other Ambulatory Visit: Payer: Self-pay

## 2020-04-28 ENCOUNTER — Ambulatory Visit: Payer: BC Managed Care – PPO | Admitting: Rehabilitative and Restorative Service Providers"

## 2020-04-28 DIAGNOSIS — R293 Abnormal posture: Secondary | ICD-10-CM

## 2020-04-28 DIAGNOSIS — M542 Cervicalgia: Secondary | ICD-10-CM | POA: Diagnosis not present
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
## 2020-05-02 ENCOUNTER — Ambulatory Visit: Payer: BC Managed Care – PPO | Admitting: Rehabilitative and Restorative Service Providers"

## 2020-05-02 ENCOUNTER — Encounter: Payer: Self-pay | Admitting: Rehabilitative and Restorative Service Providers"

## 2020-05-02 ENCOUNTER — Other Ambulatory Visit: Payer: Self-pay

## 2020-05-02 DIAGNOSIS — M542 Cervicalgia: Secondary | ICD-10-CM | POA: Diagnosis not present

## 2020-05-02 DIAGNOSIS — R293 Abnormal posture: Secondary | ICD-10-CM
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
## 2020-09-12 ENCOUNTER — Other Ambulatory Visit: Payer: Self-pay | Admitting: Obstetrics & Gynecology

## 2020-09-12 DIAGNOSIS — Z1231 Encounter for screening mammogram for malignant neoplasm of breast: Secondary | ICD-10-CM

## 2020-12-04 ENCOUNTER — Ambulatory Visit
Admission: RE | Admit: 2020-12-04 | Discharge: 2020-12-04 | Disposition: A | Discharge status: Home / Self Care | Payer: BC Managed Care – PPO | Source: Ambulatory Visit | Attending: Obstetrics & Gynecology | Admitting: Obstetrics & Gynecology

## 2020-12-04 ENCOUNTER — Other Ambulatory Visit: Payer: Self-pay

## 2020-12-04 DIAGNOSIS — Z1231 Encounter for screening mammogram for malignant neoplasm of breast: Secondary | ICD-10-CM

## 2020-12-05 ENCOUNTER — Other Ambulatory Visit: Payer: Self-pay | Admitting: Obstetrics & Gynecology

## 2020-12-05 DIAGNOSIS — R928 Other abnormal and inconclusive findings on diagnostic imaging of breast: Secondary | ICD-10-CM

## 2020-12-08 DIAGNOSIS — R928 Other abnormal and inconclusive findings on diagnostic imaging of breast: Secondary | ICD-10-CM | POA: Insufficient documentation

## 2020-12-18 ENCOUNTER — Other Ambulatory Visit: Payer: Self-pay

## 2020-12-18 ENCOUNTER — Ambulatory Visit
Admission: RE | Admit: 2020-12-18 | Discharge: 2020-12-18 | Disposition: A | Discharge status: Home / Self Care | Payer: BC Managed Care – PPO | Source: Ambulatory Visit | Attending: Obstetrics & Gynecology | Admitting: Obstetrics & Gynecology

## 2020-12-18 DIAGNOSIS — R928 Other abnormal and inconclusive findings on diagnostic imaging of breast: Secondary | ICD-10-CM

## 2021-01-26 ENCOUNTER — Other Ambulatory Visit: Payer: Self-pay | Admitting: Surgery

## 2021-01-26 ENCOUNTER — Ambulatory Visit
Admission: RE | Admit: 2021-01-26 | Discharge: 2021-01-26 | Disposition: A | Discharge status: Home / Self Care | Payer: BC Managed Care – PPO | Source: Ambulatory Visit | Attending: Surgery | Admitting: Surgery

## 2021-01-26 DIAGNOSIS — M25521 Pain in right elbow: Secondary | ICD-10-CM

## 2021-01-29 ENCOUNTER — Ambulatory Visit: Payer: BC Managed Care – PPO | Admitting: Orthopaedic Surgery

## 2021-01-29 ENCOUNTER — Encounter: Payer: Self-pay | Admitting: Orthopaedic Surgery

## 2021-01-29 DIAGNOSIS — S52134A Nondisplaced fracture of neck of right radius, initial encounter for closed fracture: Secondary | ICD-10-CM
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
## 2021-02-19 ENCOUNTER — Ambulatory Visit: Payer: BC Managed Care – PPO | Admitting: Orthopaedic Surgery

## 2021-02-19 ENCOUNTER — Encounter: Payer: Self-pay | Admitting: Orthopaedic Surgery

## 2021-02-19 ENCOUNTER — Ambulatory Visit (INDEPENDENT_AMBULATORY_CARE_PROVIDER_SITE_OTHER): Payer: BC Managed Care – PPO

## 2021-02-19 DIAGNOSIS — S52134A Nondisplaced fracture of neck of right radius, initial encounter for closed fracture: Secondary | ICD-10-CM | POA: Diagnosis not present

## 2021-02-19 NOTE — Progress Notes (Signed)
The patient is a 55 year old who is just over 3 weeks out from a right radial neck fracture that was nondisplaced.  She is doing well overall and has better motion and less pain but still having some pain which is appropriate at 3 weeks after radial neck fracture.  She has full extension of her right elbow as well as full pronation supination and flexion.  It is painful to do so but much less swelling.  3 views of the elbow shows that the fracture of the radial neck remains nondisplaced.  The elbow joint is congruent.  There is some evidence of slight healing at 3 weeks.  She will still refrain from any heavy lifting with the right arm.  I would like to see her back for final visit in 4 weeks with just 2 views of the right elbow.  All questions and concerns were answered and addressed.

## 2021-03-19 ENCOUNTER — Encounter: Payer: Self-pay | Admitting: Orthopaedic Surgery

## 2021-03-19 ENCOUNTER — Ambulatory Visit (INDEPENDENT_AMBULATORY_CARE_PROVIDER_SITE_OTHER): Payer: BC Managed Care – PPO

## 2021-03-19 ENCOUNTER — Ambulatory Visit: Payer: BC Managed Care – PPO | Admitting: Orthopaedic Surgery

## 2021-03-19 DIAGNOSIS — S52134A Nondisplaced fracture of neck of right radius, initial encounter for closed fracture: Secondary | ICD-10-CM

## 2021-03-19 NOTE — Progress Notes (Signed)
The patient is now 8 weeks status post a mechanical fall in which she sustained a nondisplaced radial neck fracture of the right elbow.  She is doing well overall.  She has decent range of motion she states and has not tried much strengthening or pushing it.  Her range of motion of the right elbow is almost entirely full with minimal pain.  The wrist exam is also normal.  2 views of the right elbow show that the radial neck fracture is healing nicely.  There is abundant callus formation and no evidence of osteonecrosis.  The elbow is well located.  She should avoid heavy lifting for at least another 4 to 6 weeks as well as no push-ups or pull-ups but otherwise should increase her activities as comfort allows.  All questions and concerns were answered and addressed.  Follow-up can be as needed.  If she experiences elbow pain over 3 months from now has any issues she knows to let us know.

## 2021-09-01 IMAGING — US US BREAST*R* LIMITED INC AXILLA
1 series · 6 of 6 positions shown · non-contrast
Comparison: Previous exam(s).

CLINICAL DATA: Screening recall for a possible right breast mass.

EXAM:
DIGITAL DIAGNOSTIC UNILATERAL RIGHT MAMMOGRAM WITH IMPLANTS, CAD AND
TOMOSYNTHESIS; ULTRASOUND RIGHT BREAST LIMITED
TECHNIQUE: Right digital diagnostic mammography and breast tomosynthesis was
performed. The images were evaluated with computer-aided detection.
Standard and/or implant displaced views were performed.; Targeted
ultrasound examination of the right breast was performed.

[Series 1: us breast*right* limited inc axilla · 0.06mm/px · 6 of 6 slices shown]
[im 1/6]
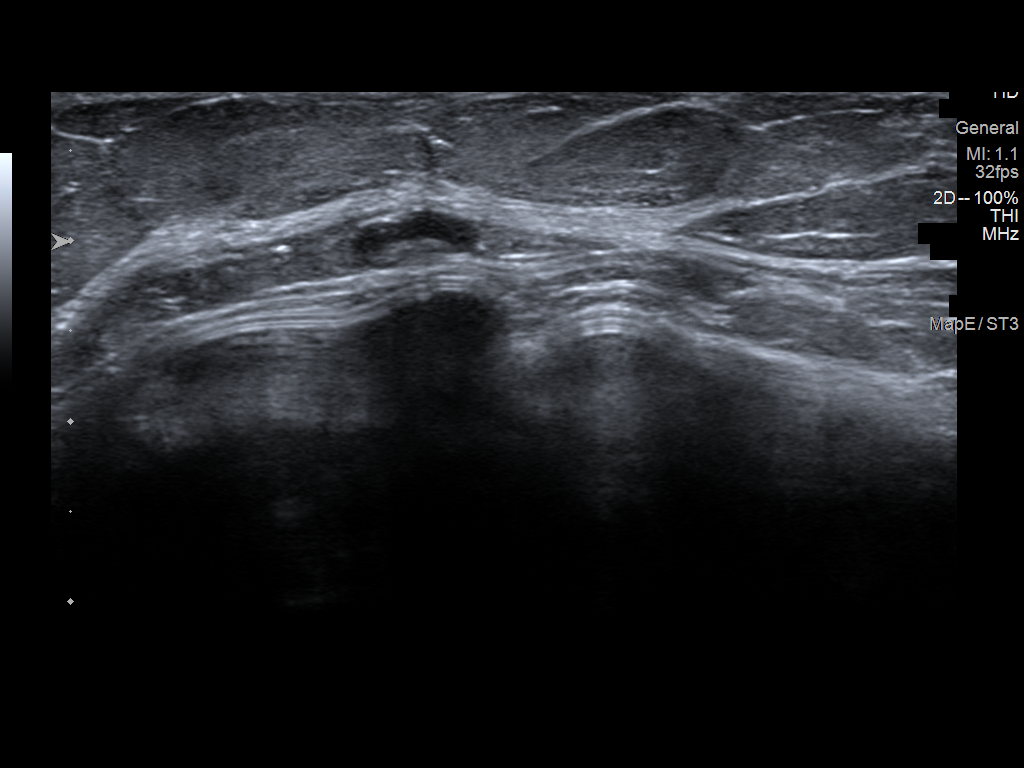
[im 2/6]
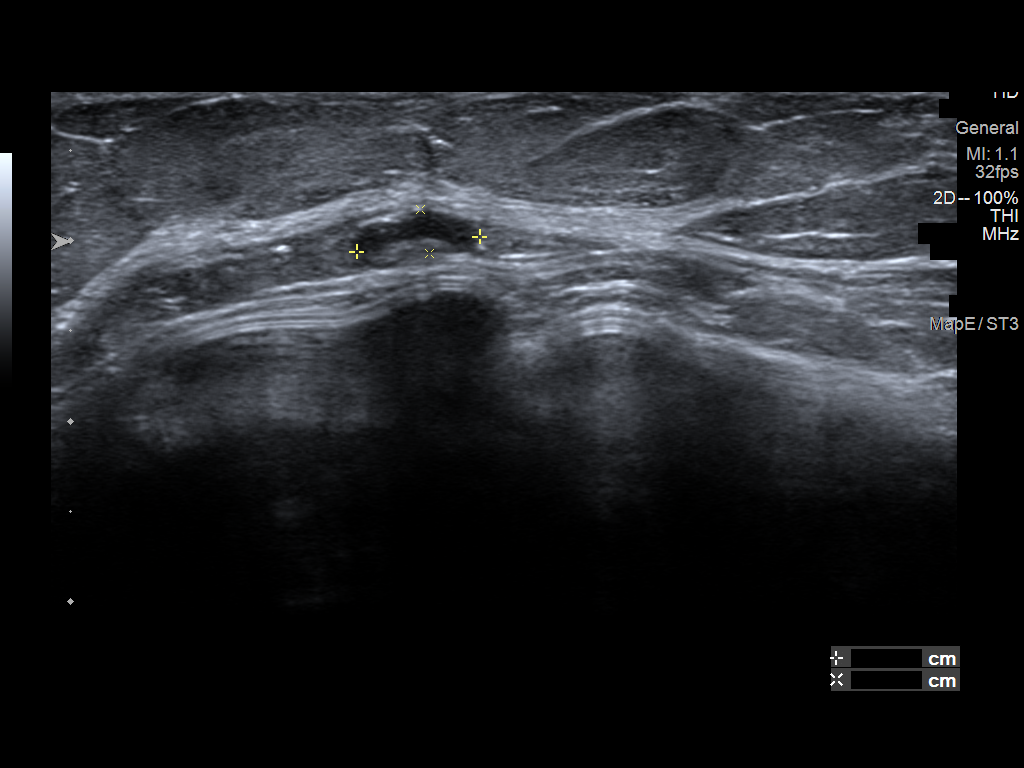
[im 3/6]
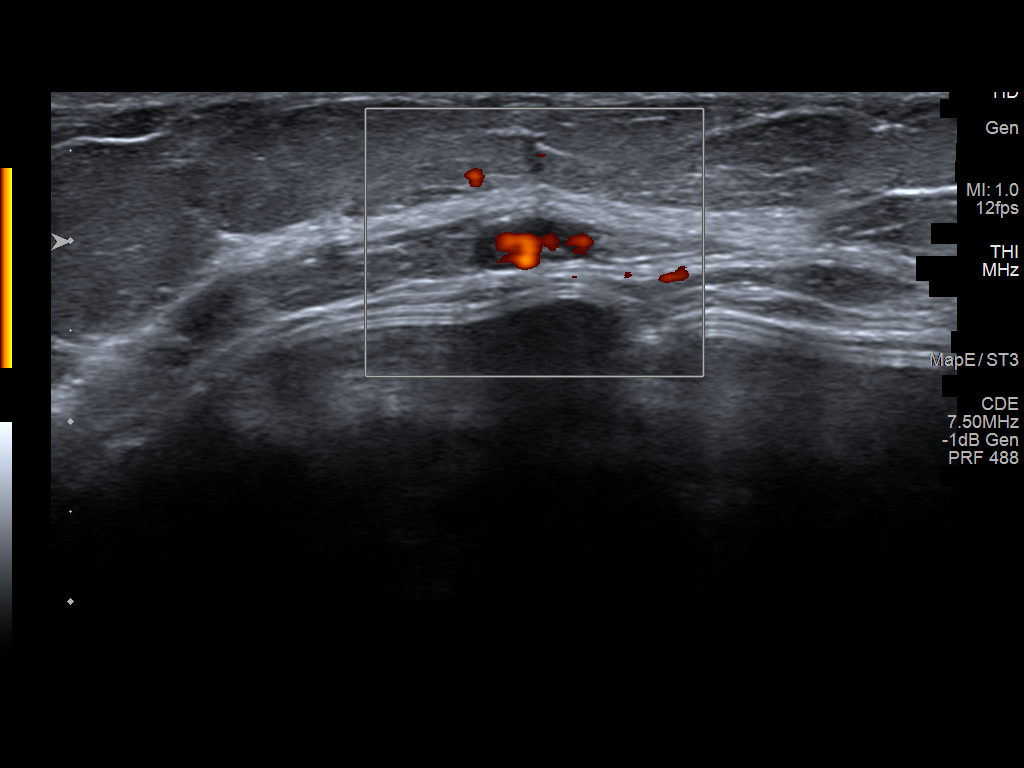
[im 4/6]
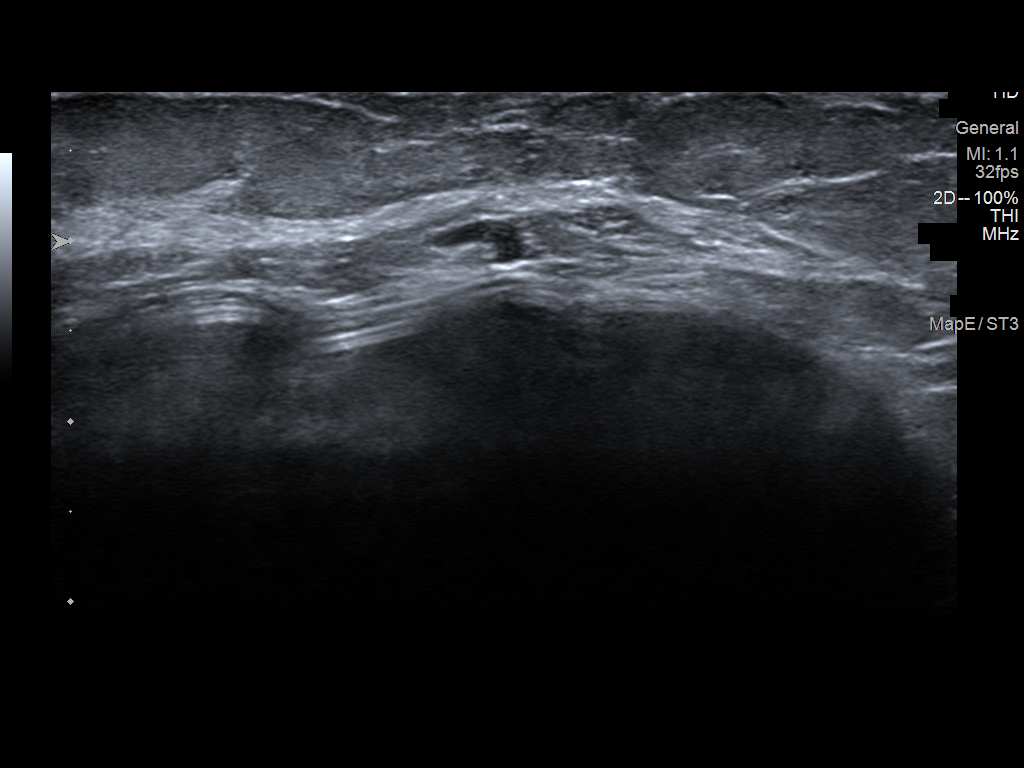
[im 5/6]
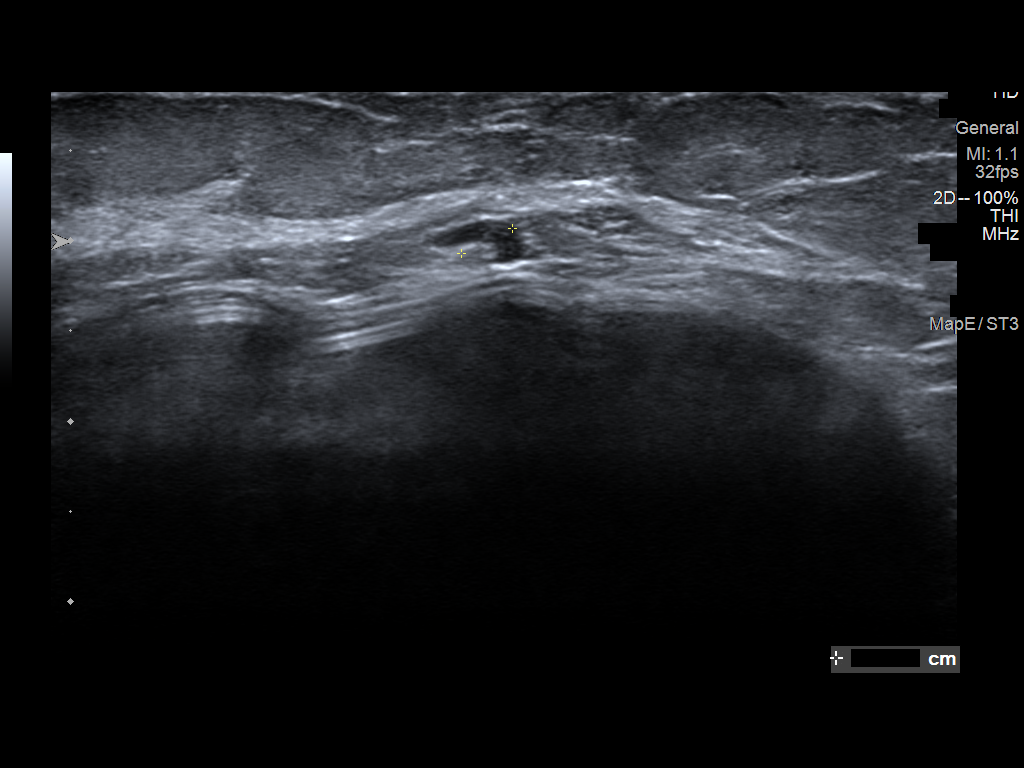
[im 6/6]
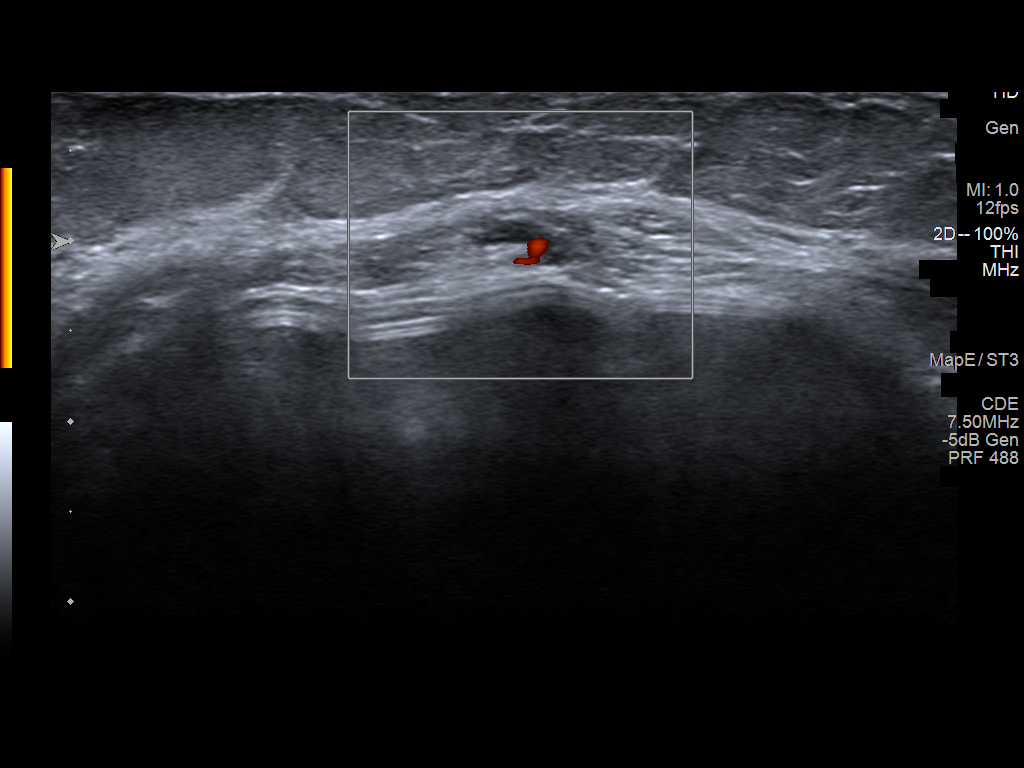

[6 of 6 positions shown; findings below may reference images not displayed]

ACR Breast Density Category c: The breast tissue is heterogeneously
dense, which may obscure small masses.
FINDINGS: The possible mass, noted in the posteromedial aspect of the right
breast on the current screening implant displacement cc view,
persists on the diagnostic images as an oval, circumscribed, 6 mm
mass with evidence of internal fat.

There are no other masses, no areas of architectural distortion and
no suspicious calcifications.

The patient has retropectoral implants.

Targeted ultrasound is performed, showing a normal intramammary
lymph node in the posterior aspect of the right breast at 3:30
o'clock, 8 cm the nipple, measuring 7 x 3 x 3 mm, consistent in
size, shape and location to the mammographic mass.
IMPRESSION: 1. No evidence of breast malignancy.
2. Normal intramammary lymph node in the posteromedial aspect of the
right breast accounts for the mammographic mass.

RECOMMENDATION:
Screening mammogram in one year.(Code:95-W-TE3)

I have discussed the findings and recommendations with the patient.
If applicable, a reminder letter will be sent to the patient
regarding the next appointment.

BI-RADS CATEGORY  1: Negative.

## 2022-01-15 ENCOUNTER — Ambulatory Visit: Payer: Self-pay | Admitting: *Deleted

## 2022-01-15 NOTE — Telephone Encounter (Signed)
?  Chief Complaint: Elbow pain, lump ?Symptoms: FX elbow 10 months ago, now with lump at elbow and pain 2/10 ?Frequency: 1 week ?Pertinent Negatives: Patient denies decreased RM, redness, warmth ?Disposition: [] ED /[x] Urgent Care (no appt availability in office) / [] Appointment(In office/virtual)/ []  [facility]/ [] Home Care/ [] Refused Recommended Disposition /[] [facility]/ []  Follow-up with PCP ?Additional Notes: NPA with LB 02/07/22. Advised [facility] in meantime. States will follow disposition.  No PCP ? ? ? ? ?Reason for Disposition ? [1] After 2 weeks AND [2] still painful ? ?Answer Assessment - Initial Assessment Questions ?1. MECHANISM: "How did the injury happen?" ?   fall ?2. ONSET: "When did the injury happen?" (Minutes or hours ago)  ?    10 months ago ?3. LOCATION: "What part of the elbow is the injured?"  ?     ?4. APPEARANCE of INJURY: "What does the injury look like?"  ?     ?5. SEVERITY: "Can you use the elbow normally?"  "Can you bend it and straighten it fully?" ?    Yes ?6. SIZE: For cuts, bruises, or swelling, ask: "How large is it?" (e.g., inches or centimeters; entire joint)  ?     ?7. PAIN: "Is there pain?" If Yes, ask: "How bad is the pain?"    (Scale 1-10; or mild, moderate, severe) ?  - NONE (0): no pain. ?  - MILD (1-3): doesn't interfere with normal activities. ?  - MODERATE (4-7): interferes with normal activities (e.g., work or school) or awakens from sleep. ?  - SEVERE (8-10): excruciating pain, unable to do any normal activities, unable to use arm at all. ?    For 2 months pain more prominent, lump on elbow, soft. Pain is 2/10 ? ?Protocols used: Elbow Injury-A-AH ? ?

## 2022-01-29 ENCOUNTER — Ambulatory Visit (HOSPITAL_BASED_OUTPATIENT_CLINIC_OR_DEPARTMENT_OTHER): Payer: 59 | Admitting: Nurse Practitioner

## 2022-01-29 ENCOUNTER — Other Ambulatory Visit: Payer: Self-pay

## 2022-01-29 ENCOUNTER — Encounter (HOSPITAL_BASED_OUTPATIENT_CLINIC_OR_DEPARTMENT_OTHER): Payer: Self-pay | Admitting: Nurse Practitioner

## 2022-01-29 VITALS — BP 132/84 | HR 80 | Temp 97.6°F | Ht 69.0 in | Wt 183.0 lb

## 2022-01-29 DIAGNOSIS — R159 Full incontinence of feces: Secondary | ICD-10-CM

## 2022-01-29 DIAGNOSIS — M25521 Pain in right elbow: Secondary | ICD-10-CM

## 2022-01-29 MED ORDER — MELOXICAM 15 MG PO TABS: 15.0000 mg | ORAL_TABLET | Freq: Every day | ORAL | 0 refills | Status: DC

## 2022-01-29 NOTE — Patient Instructions (Addendum)
Thank you for choosing [facility] [facility] for your Primary Care needs. I am excited for the opportunity to partner with you to meet your health care goals. It was a pleasure meeting you today! ? ?Recommendations from today's visit: ?I recommend keeping the elbow covered and padded for the next 2 weeks. Use ice on the area at least twice a day. If this is not improved in the next 2 weeks, please schedule to see Dr. Burnard Bunting for possible injection or aspiration of the joint.  ?I will send your medication refills in to the pharmacy for you.  ?I have sent the referral to  GI- if you dont hear from them in the next week, please let us know.  ? ? ?Information on diet, exercise, and health maintenance recommendations are listed below. This is information to help you be sure you are on track for optimal health and monitoring.  ? ?Please look over this and let us know if you have any questions or if you have completed any of the health maintenance outside of [facility] so that we can be sure your records are up to date.  ?___________________________________________________________ ?About Me: ?I am an Adult-Geriatric Nurse Practitioner with a background in caring for patients for more than 20 years with a strong intensive care background. I provide primary care and sports medicine services to patients age 16 and older within this office. My education had a strong focus on caring for the older adult population, which I am passionate about. I am also the director of the APP Fellowship with [facility].  ? ?My desire is to provide you with the best service through preventive medicine and supportive care. I consider you a part of the medical team and value your input. I work diligently to ensure that you are heard and your needs are met in a safe and effective manner. I want you to feel comfortable with me as your provider and want you to know that your health concerns are important to me. ? ?For  your information, our office hours are: ?Monday, Tuesday, and Thursday 8:00 AM - 5:00 PM ?Wednesday and Friday 8:00 AM - 12:00 PM.  ? ?In my time away from the office I am teaching new APP's within the system and am unavailable, but my partner, Dr. Burnard Bunting is in the office for emergent needs.  ? ?If you have questions or concerns, please call our office at [phone] or send [place] a MyChart message and we will respond as quickly as possible.  ?____________________________________________________________ ?MyChart:  ?For all urgent or time sensitive needs we ask that you please call the office to avoid delays. Our number is [phone]. ?MyChart is not constantly monitored and due to the large volume of messages a day, replies may take up to 72 business hours. ? ?MyChart Policy: ?MyChart allows for you to see your visit notes, after visit summary, provider recommendations, lab and tests results, make an appointment, request refills, and contact your provider or the office for non-urgent questions or concerns. Providers are seeing patients during normal business hours and do not have built in time to review MyChart messages.  ?We ask that you allow a minimum of 3 business days for responses to [employer]. For this reason, please do not send urgent requests through [place]. Please call the office at [phone]. ?New and ongoing conditions may require a visit. We have virtual and in person visit available for your convenience.  ?Complex MyChart concerns may require  a visit. Your provider may request you schedule a virtual or in person visit to ensure we are providing the best care possible. ?MyChart messages sent after 11:00 AM on Friday will not be received by the provider until Monday morning.  ?  ?Lab and Test Results: ?You will receive your lab and test results on MyChart as soon as they are completed and results have been sent by the lab or testing facility. Due to this service, you will receive your  results BEFORE your provider.  ?I review lab and tests results each morning prior to seeing patients. Some results require collaboration with other providers to ensure you are receiving the most appropriate care. For this reason, we ask that you please allow a minimum of 3-5 business days from the time the ALL results have been received for your provider to receive and review lab and test results and contact you about these.  ?Most lab and test result comments from the provider will be sent through [place]. Your provider may recommend changes to the plan of care, follow-up visits, repeat testing, ask questions, or request an office visit to discuss these results. You may reply directly to this message or call the office at [phone] to provide information for the provider or set up an appointment. ?In some instances, you will be called with test results and recommendations. Please let us know if this is preferred and we will make note of this in your chart to provide this for you.    ?If you have not heard a response to your lab or test results in 5 business days from all results returning to [facility], please call the office to let us know. We ask that you please avoid calling prior to this time unless there is an emergent concern. Due to high call volumes, this can delay the resulting process. ? ?After Hours: ?For all non-emergency after hours needs, please call the office at [phone] and select the option to reach the on-call provider service. On-call services are shared between multiple [facility] offices and therefore it will not be possible to speak directly with your provider. On-call providers may provide medical advice and recommendations, but are unable to provide refills for maintenance medications.  ?For all emergency or urgent medical needs after normal business hours, we recommend that you seek care at the closest Urgent Care or Emergency Department to ensure appropriate treatment in a timely  manner.  ?[facility] has a 24 hour emergency room located on the ground floor for your convenience.  ? ?Urgent Concerns During the Business Day ?Providers are seeing patients from 8AM to [place] with a busy schedule and are most often not able to respond to non-urgent calls until the end of the day or the next business day. ?If you should have URGENT concerns during the day, please call and speak to the nurse or schedule a same day appointment so that we can address your concern without delay.  ? ?Thank you, again, for choosing me as your health care partner. I appreciate your trust and look forward to learning more about you.  ? ?Worthy Keeler, DNP, AGNP-c ?___________________________________________________________ ? ?Health Maintenance Recommendations ?Screening Testing ?Mammogram ?Every 1 -2 years based on history and risk factors ?Starting at age 40 ?Pap Smear ?Ages 21-39 every 3 years ?Ages 23-65 every 5 years with HPV testing ?More frequent testing may be required based on results and history ?Colon Cancer Screening ?Every 1-10 years based on test performed, risk factors,  and history ?Starting at age 53 ?Bone Density Screening ?Every 2-10 years based on history ?Starting at age 47 for women ?Recommendations for men differ based on medication usage, history, and risk factors ?AAA Screening ?One time ultrasound ?Men 63-23 years old who have every smoked ?Lung Cancer Screening ?Low Dose Lung CT every 12 months ?Age 23-80 years with a 30 pack-year smoking history who still smoke or who have quit within the last 15 years ? ?Screening Labs ?Routine  Labs: Complete Blood Count (CBC), Complete Metabolic Panel (CMP), Cholesterol (Lipid Panel) ?Every 6-12 months based on history and medications ?May be recommended more frequently based on current conditions or previous results ?Hemoglobin A1c Lab ?Every 3-12 months based on history and previous results ?Starting at age 72 or earlier with diagnosis  of diabetes, high cholesterol, BMI >26, and/or risk factors ?Frequent monitoring for patients with diabetes to ensure blood sugar control ?Thyroid Panel (TSH w/ T3 & T4) ?Every 6 months based on history, symp

## 2022-01-29 NOTE — Assessment & Plan Note (Signed)
Appx 3cm spherical, firm, rubbery mass at right olecranon process. Non-mobile. Tender to palpation. No signs of infection present.  ?Suspect ganglion cyst vs bursitis related to recent fall. No concerns for fracture present given limitations in edema, erythema, ecchymosis, and intact ROM.  ?Discussed situation with Dr. Ihor Dow. Recommendations are for conservative measures to include protection and NSAIDs for 2 weeks and monitor for any new or changing symptoms. If no improvement, recommend return to see Dr. Ihor Dow for evaluation for possible injection vs. Aspiration. She is agreeable to this. Information provided on handout for patient.  ?

## 2022-01-29 NOTE — Assessment & Plan Note (Signed)
Incontinent episodes only while running with no etiology.  ?Suspect possible decreased rectal tone with soft stools are causing the episodes with increased gravitational pull from running.  ?Recommend GI evaluation for further recommendations given the significance of the symptoms and severity.  ?No alarm symptoms present today.  ?
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
## 2022-02-07 ENCOUNTER — Encounter (HOSPITAL_BASED_OUTPATIENT_CLINIC_OR_DEPARTMENT_OTHER): Payer: Self-pay | Admitting: Nurse Practitioner

## 2022-02-07 ENCOUNTER — Ambulatory Visit: Payer: BC Managed Care – PPO | Admitting: Internal Medicine

## 2022-02-07 ENCOUNTER — Encounter: Payer: Self-pay | Admitting: Nurse Practitioner

## 2022-02-08 ENCOUNTER — Ambulatory Visit (HOSPITAL_BASED_OUTPATIENT_CLINIC_OR_DEPARTMENT_OTHER): Payer: BC Managed Care – PPO | Admitting: Nurse Practitioner

## 2022-02-28 ENCOUNTER — Encounter: Payer: Self-pay | Admitting: Nurse Practitioner

## 2022-02-28 ENCOUNTER — Ambulatory Visit: Payer: 59 | Admitting: Nurse Practitioner

## 2022-02-28 VITALS — BP 112/64 | HR 59 | Ht 69.0 in | Wt 182.0 lb

## 2022-02-28 DIAGNOSIS — R151 Fecal smearing: Secondary | ICD-10-CM | POA: Diagnosis not present
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
## 2022-02-28 NOTE — Patient Instructions (Addendum)
If you are age 55 or younger, your body mass index should be between 19-25. Your Body mass index is 26.88 kg/m?Marland Kitchen If this is out of the aformentioned range listed, please consider follow up with your Primary Care Provider.  ?________________________________________________________ ? ?The [facility] providers would like to encourage you to use [facility] to communicate with providers for non-urgent requests or questions.  Due to long hold times on the telephone, sending your provider a message by [facility] may be a faster and more efficient way to get a response.  Please allow 48 business hours for a response.  Please remember that this is for non-urgent requests.  ?_______________________________________________________ ? ?Start daily Benefiber ? ?Use glycerin suppository 2 hours prior to going on a run ? ?Contact us in about 4 weeks to let me know how things are going.  ? ?Thank you for entrusting me with your care and choosing [facility]. ? ?Willette Cluster, NP ?

## 2022-03-01 NOTE — Progress Notes (Signed)
Addendum: Reviewed and agree with assessment and management plan. Lamari Youngers M, MD  

## 2022-05-03 ENCOUNTER — Other Ambulatory Visit (HOSPITAL_BASED_OUTPATIENT_CLINIC_OR_DEPARTMENT_OTHER): Payer: Self-pay | Admitting: Nurse Practitioner

## 2022-05-03 DIAGNOSIS — F329 Major depressive disorder, single episode, unspecified: Secondary | ICD-10-CM

## 2022-05-03 DIAGNOSIS — F5101 Primary insomnia: Secondary | ICD-10-CM

## 2022-05-03 DIAGNOSIS — F411 Generalized anxiety disorder: Secondary | ICD-10-CM

## 2022-05-03 MED ORDER — FLUOXETINE HCL 40 MG PO CAPS: 40.0000 mg | ORAL_CAPSULE | Freq: Every day | ORAL | 3 refills | Status: DC

## 2022-05-03 MED ORDER — BUPROPION HCL ER (XL) 150 MG PO TB24: ORAL_TABLET | ORAL | 3 refills | Status: DC

## 2022-05-03 MED ORDER — CLONAZEPAM 1 MG PO TBDP: 0.5000 mg | ORAL_TABLET | Freq: Every day | ORAL | 2 refills | Status: DC | PRN

## 2022-05-03 MED ORDER — ZOLPIDEM TARTRATE ER 12.5 MG PO TBCR: 12.5000 mg | EXTENDED_RELEASE_TABLET | Freq: Every evening | ORAL | 2 refills | Status: DC | PRN

## 2022-05-29 ENCOUNTER — Other Ambulatory Visit (HOSPITAL_BASED_OUTPATIENT_CLINIC_OR_DEPARTMENT_OTHER): Payer: Self-pay | Admitting: Nurse Practitioner

## 2022-05-29 DIAGNOSIS — F5101 Primary insomnia: Secondary | ICD-10-CM

## 2022-06-24 ENCOUNTER — Ambulatory Visit (HOSPITAL_BASED_OUTPATIENT_CLINIC_OR_DEPARTMENT_OTHER): Payer: 59

## 2022-06-24 ENCOUNTER — Other Ambulatory Visit (HOSPITAL_BASED_OUTPATIENT_CLINIC_OR_DEPARTMENT_OTHER): Payer: Self-pay

## 2022-06-24 DIAGNOSIS — Z0000 Encounter for general adult medical examination without abnormal findings: Secondary | ICD-10-CM

## 2022-06-25 LAB — VITAMIN D 25 HYDROXY (VIT D DEFICIENCY, FRACTURES): Vit D, 25-Hydroxy: 34.5 ng/mL (ref 30.0–100.0)

## 2022-06-25 LAB — CBC WITH DIFF/PLATELET
Basophils Absolute: 0 10*3/uL (ref 0.0–0.2)
Basos: 0 %
EOS (ABSOLUTE): 0.1 10*3/uL (ref 0.0–0.4)
Eos: 2 %
Hematocrit: 40.2 % (ref 34.0–46.6)
Hemoglobin: 13 g/dL (ref 11.1–15.9)
Immature Grans (Abs): 0 10*3/uL (ref 0.0–0.1)
Immature Granulocytes: 0 %
Lymphocytes Absolute: 1.5 10*3/uL (ref 0.7–3.1)
Lymphs: 26 %
MCH: 29.3 pg (ref 26.6–33.0)
MCHC: 32.3 g/dL (ref 31.5–35.7)
MCV: 91 fL (ref 79–97)
Monocytes Absolute: 0.5 10*3/uL (ref 0.1–0.9)
Monocytes: 9 %
Neutrophils Absolute: 3.6 10*3/uL (ref 1.4–7.0)
Neutrophils: 63 %
Platelets: 291 10*3/uL (ref 150–450)
RBC: 4.44 x10E6/uL (ref 3.77–5.28)
RDW: 12.7 % (ref 11.7–15.4)
WBC: 5.8 10*3/uL (ref 3.4–10.8)

## 2022-06-25 LAB — THYROID PANEL WITH TSH
Free Thyroxine Index: 1.8 (ref 1.2–4.9)
T3 Uptake Ratio: 25 % (ref 24–39)
T4, Total: 7.2 ug/dL (ref 4.5–12.0)
TSH: 1.31 u[IU]/mL (ref 0.450–4.500)

## 2022-06-25 LAB — B12 AND FOLATE PANEL
Folate: 3.1 ng/mL (ref >3.0)
Vitamin B-12: 227 pg/mL — ABNORMAL LOW (ref 232–1245)

## 2022-06-25 LAB — COMPREHENSIVE METABOLIC PANEL
ALT: 14 IU/L (ref 0–32)
AST: 10 IU/L (ref 0–40)
Albumin/Globulin Ratio: 1.5 (ref 1.2–2.2)
Albumin: 4.1 g/dL (ref 3.8–4.9)
Alkaline Phosphatase: 89 IU/L (ref 44–121)
BUN/Creatinine Ratio: 17 (ref 9–23)
BUN: 14 mg/dL (ref 6–24)
Bilirubin Total: 0.3 mg/dL (ref 0.0–1.2)
CO2: 19 mmol/L — ABNORMAL LOW (ref 20–29)
Calcium: 9.4 mg/dL (ref 8.7–10.2)
Chloride: 106 mmol/L (ref 96–106)
Creatinine, Ser: 0.84 mg/dL (ref 0.57–1.00)
Globulin, Total: 2.7 g/dL (ref 1.5–4.5)
Glucose: 94 mg/dL (ref 70–99)
Potassium: 4.4 mmol/L (ref 3.5–5.2)
Sodium: 139 mmol/L (ref 134–144)
Total Protein: 6.8 g/dL (ref 6.0–8.5)
eGFR: 83 mL/min/{1.73_m2} (ref >59)

## 2022-06-25 LAB — IRON,TIBC AND FERRITIN PANEL
Ferritin: 45 ng/mL (ref 15–150)
Iron Saturation: 16 % (ref 15–55)
Iron: 49 ug/dL (ref 27–159)
Total Iron Binding Capacity: 314 ug/dL (ref 250–450)
UIBC: 265 ug/dL (ref 131–425)

## 2022-06-25 LAB — LIPID PANEL
Chol/HDL Ratio: 4.3 ratio (ref 0.0–4.4)
Cholesterol, Total: 199 mg/dL (ref 100–199)
HDL: 46 mg/dL (ref >39)
LDL Chol Calc (NIH): 135 mg/dL — ABNORMAL HIGH (ref 0–99)
Triglycerides: 99 mg/dL (ref 0–149)
VLDL Cholesterol Cal: 18 mg/dL (ref 5–40)

## 2022-06-25 LAB — HEMOGLOBIN A1C
Est. average glucose Bld gHb Est-mCnc: 111 mg/dL
Hgb A1c MFr Bld: 5.5 % (ref 4.8–5.6)

## 2022-07-01 ENCOUNTER — Encounter (HOSPITAL_BASED_OUTPATIENT_CLINIC_OR_DEPARTMENT_OTHER): Payer: 59 | Admitting: Nurse Practitioner

## 2022-07-04 ENCOUNTER — Other Ambulatory Visit: Payer: Self-pay | Admitting: Obstetrics & Gynecology

## 2022-07-04 DIAGNOSIS — Z1231 Encounter for screening mammogram for malignant neoplasm of breast: Secondary | ICD-10-CM

## 2022-07-10 ENCOUNTER — Ambulatory Visit (INDEPENDENT_AMBULATORY_CARE_PROVIDER_SITE_OTHER): Payer: 59 | Admitting: Nurse Practitioner

## 2022-07-10 DIAGNOSIS — E538 Deficiency of other specified B group vitamins: Secondary | ICD-10-CM | POA: Diagnosis not present

## 2022-07-10 MED ORDER — CYANOCOBALAMIN 1000 MCG/ML IJ SOLN
1000.0000 ug | INTRAMUSCULAR | Status: DC
  Administered 2022-07-10: 1000 ug via INTRAMUSCULAR

## 2022-07-25 ENCOUNTER — Ambulatory Visit
Admission: RE | Admit: 2022-07-25 | Discharge: 2022-07-25 | Disposition: A | Discharge status: Home / Self Care | Payer: 59 | Source: Ambulatory Visit | Attending: Obstetrics & Gynecology | Admitting: Obstetrics & Gynecology

## 2022-07-25 DIAGNOSIS — Z1231 Encounter for screening mammogram for malignant neoplasm of breast: Secondary | ICD-10-CM

## 2022-08-12 ENCOUNTER — Ambulatory Visit (INDEPENDENT_AMBULATORY_CARE_PROVIDER_SITE_OTHER): Payer: 59 | Admitting: Nurse Practitioner

## 2022-08-12 VITALS — BP 132/84 | HR 80 | Ht 69.0 in | Wt 183.0 lb

## 2022-08-12 DIAGNOSIS — E538 Deficiency of other specified B group vitamins: Secondary | ICD-10-CM | POA: Diagnosis not present

## 2022-08-12 MED ORDER — CYANOCOBALAMIN 1000 MCG/ML IJ SOLN
1000.0000 ug | INTRAMUSCULAR | Status: DC
  Administered 2022-08-12: 1000 ug via INTRAMUSCULAR

## 2022-08-12 NOTE — Addendum Note (Signed)
Addended by: Briza Bark, Clarise Cruz E on: 08/12/2022 09:15 AM   Modules accepted: Level of Service

## 2022-08-12 NOTE — Progress Notes (Signed)
Vanessa Shah is here for a vitamin B 12 injection. Denies muscle cramps, weakness or irregular heart rate.    Patient tolerated injection well without complications. Patient advised to schedule next injection 30 days from today.   I have reviewed this encounter including the documentation in this note and/or discussed this patient with the [facility]. All orders and medical decision making have been approved by the supervising primary care provider.   I am certifying that I agree with the content of this note as supervising primary care provider.   Orma Render, DNP, AGNP-c

## 2022-08-15 ENCOUNTER — Emergency Department (HOSPITAL_COMMUNITY): Payer: 59

## 2022-08-15 ENCOUNTER — Emergency Department (HOSPITAL_COMMUNITY): Payer: Worker's Compensation

## 2022-08-15 ENCOUNTER — Other Ambulatory Visit: Payer: Self-pay

## 2022-08-15 ENCOUNTER — Emergency Department (HOSPITAL_COMMUNITY)
Admission: EM | Admit: 2022-08-15 | Discharge: 2022-08-15 | Disposition: A | Discharge status: Home / Self Care | Payer: Worker's Compensation | Attending: Emergency Medicine | Admitting: Emergency Medicine

## 2022-08-15 DIAGNOSIS — S42214A Unspecified nondisplaced fracture of surgical neck of right humerus, initial encounter for closed fracture: Secondary | ICD-10-CM | POA: Insufficient documentation

## 2022-08-15 DIAGNOSIS — W231XXA Caught, crushed, jammed, or pinched between stationary objects, initial encounter: Secondary | ICD-10-CM | POA: Diagnosis not present

## 2022-08-15 DIAGNOSIS — S4991XA Unspecified injury of right shoulder and upper arm, initial encounter: Secondary | ICD-10-CM | POA: Diagnosis present

## 2022-08-15 DIAGNOSIS — Y92511 Restaurant or cafe as the place of occurrence of the external cause: Secondary | ICD-10-CM | POA: Diagnosis not present

## 2022-08-15 MED ORDER — OXYCODONE-ACETAMINOPHEN 5-325 MG PO TABS: 1.0000 | ORAL_TABLET | Freq: Four times a day (QID) | ORAL | 0 refills | Status: DC | PRN

## 2022-08-15 MED ORDER — OXYCODONE-ACETAMINOPHEN 5-325 MG PO TABS
1.0000 | ORAL_TABLET | Freq: Once | ORAL | Status: AC
  Administered 2022-08-15: 1 via ORAL
  Filled 2022-08-15: qty 1

## 2022-08-15 NOTE — ED Provider Notes (Signed)
[facility] DEPT Provider Note   CSN: [id number] Arrival date & time: 08/15/22  1845     History  Chief Complaint  Patient presents with   Shoulder Injury    Vanessa Shah is a 56 y.o. female.  Patient working in [employer].  She was in a tight space with a coworker and try to get around her by grabbing onto a door frame.  She then stumbled and her body came down onto her buttocks.  She felt a pop in her right shoulder has not been able to move it since.  EMS was called.  They are concerned for dislocation.  Patient has had a hairline fracture in his right elbow previously without surgery.  No history of shoulder dislocation.  Denies hitting her head or losing consciousness.  No neck or back pain.  No weakness, numbness or tingling.  No chest pain or abdominal pain. Unable to move right arm since with pain is improving.  States most the pain is to her right lateral shoulder  The history is provided by the patient and the EMS personnel.  Shoulder Injury       Home Medications Prior to Admission medications   Medication Sig Start Date End Date Taking? Authorizing Provider  buPROPion (WELLBUTRIN XL) 150 MG 24 hr tablet Take 300 mg by mouth daily 05/03/22   Early, Coralee Pesa, NP  clonazePAM (KLONOPIN) 1 MG disintegrating tablet Take 0.5-1 tablets (0.5-1 mg total) by mouth daily as needed. Do not take with Ambien or Alcohol. For panic. 05/03/22   Early, Coralee Pesa, NP  FLUoxetine (PROZAC) 40 MG capsule Take 1 capsule (40 mg total) by mouth daily. 05/03/22   Orma Render, NP  Glucosamine HCl (GLUCOSAMINE PO) Take 1 tablet by mouth daily.    [provider]  meloxicam (MOBIC) 15 MG tablet Take 1 tablet (15 mg total) by mouth daily. 01/29/22   Orma Render, NP  zolpidem (AMBIEN CR) 12.5 MG CR tablet TAKE 1 TABLET(12.5 MG) BY MOUTH AT BEDTIME AS NEEDED FOR SLEEP 05/31/22   Early, Coralee Pesa, NP      Allergies    Morphine and related, Penicillins,  Relafen [nabumetone], and Sulfa antibiotics    Review of Systems   Review of Systems  Musculoskeletal:  Positive for arthralgias and myalgias.   all other systems are negative except as noted in the HPI and PMH.    Physical Exam Updated Vital Signs BP (!) 170/96 (BP Location: Left Arm)   Pulse 63   Temp 98.4 F (36.9 C) (Oral)   Resp 18   Ht 5\' 9"  (1.753 m)   Wt 83 kg   SpO2 99%   BMI 27.02 kg/m  Physical Exam Vitals and nursing note reviewed.  Constitutional:      General: She is not in acute distress.    Appearance: She is well-developed. She is not ill-appearing.  HENT:     Head: Normocephalic and atraumatic.     Mouth/Throat:     Pharynx: No oropharyngeal exudate.  Eyes:     Conjunctiva/sclera: Conjunctivae normal.     Pupils: Pupils are equal, round, and reactive to light.  Neck:     Comments: No meningismus. Cardiovascular:     Rate and Rhythm: Normal rate and regular rhythm.     Heart sounds: Normal heart sounds. No murmur heard. Pulmonary:     Effort: Pulmonary effort is normal. No respiratory distress.     Breath sounds: Normal breath  sounds.  Chest:     Chest wall: No tenderness.  Abdominal:     Palpations: Abdomen is soft.     Tenderness: There is no abdominal tenderness. There is no guarding or rebound.  Musculoskeletal:        General: Swelling and tenderness present. No deformity.     Cervical back: Normal range of motion and neck supple.     Comments: No obvious shoulder dislocation.  There is tenderness to the right lateral anterior shoulder with reduced range of motion.  Flexion extension intact of right elbow.  Cardinal hand movements are intact.  Distal radial pulses intact.  Axillary nerve sensation is intact.  Skin:    General: Skin is warm.  Neurological:     Mental Status: She is alert and oriented to person, place, and time.     Cranial Nerves: No cranial nerve deficit.     Motor: No abnormal muscle tone.     Coordination: Coordination  normal.     Comments:  5/5 strength throughout. CN 2-12 intact.Equal grip strength.   Psychiatric:        Behavior: Behavior normal.     ED Results / Procedures / Treatments   Labs (all labs ordered are listed, but only abnormal results are displayed) Labs Reviewed - No data to display  EKG None  Radiology CT Shoulder Right Wo Contrast  Result Date: 08/15/2022 CLINICAL DATA:  Work injury with the shoulder fracture. EXAM: CT OF THE UPPER RIGHT EXTREMITY WITHOUT CONTRAST TECHNIQUE: Multidetector CT imaging of the upper right extremity was performed according to the standard protocol. RADIATION DOSE REDUCTION: This exam was performed according to the departmental dose-optimization program which includes automated exposure control, adjustment of the mA and/or kV according to patient size and/or use of iterative reconstruction technique. COMPARISON:  Two-view right shoulder series earlier today. FINDINGS: Bones/Joint/Cartilage The bone mineralization is normal. There is a transverse oblique nondisplaced surgical neck fracture of the proximal humerus, and an additional nondisplaced longitudinal fracture intersecting with the transverse oblique fracture line extending up through the base of the greater tuberosity. There is no dislocation or further visible fractures. There is a downsloping type 2 acromion and joint space narrowing and spurring at the [facility] joint, unremarkable glenohumeral joint except for a small joint effusion versus hemarthrosis. No pathologic bone lesion is seen. Ligaments Suboptimally assessed by CT. Muscles and Tendons There is normal muscle bulk and noncontrast CT attenuation within the muscles. No intramuscular hematoma is seen. The rotator cuff tendons are not well seen with this technique but no through and through tendon tears are suspected. Soft tissues No acute findings. No right axillary adenopathy. A subpectoral right breast implant is partially visible. The visualized portion  of the right lung is clear. IMPRESSION: Nondisplaced proximal humeral fractures, with transverse oblique surgical neck fracture and a longitudinal fracture extending through the base of the greater tuberosity. 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 Small glenohumeral joint effusion or hemarthrosis. Electronically Signed   By: Telford Nab M.D.   On: 08/15/2022 22:04   DG Elbow Complete Right  Result Date: 08/15/2022 CLINICAL DATA:  Pain post fall EXAM: RIGHT ELBOW - COMPLETE 3+ VIEW COMPARISON:  None Available. FINDINGS: There is no evidence of fracture, dislocation, or joint effusion. There is no evidence of arthropathy or other focal bone abnormality. Soft tissues are unremarkable. IMPRESSION: Negative. Electronically Signed   By: Lucrezia Europe M.D.   On: 08/15/2022 20:15   DG Shoulder Right  Result  Date: 08/15/2022 CLINICAL DATA:  Pain post fall EXAM: RIGHT SHOULDER - 2+ VIEW COMPARISON:  None Available. FINDINGS: Subtle cortical disruption at the level of the surgical neck of the humerus seen only on 1 side or on a single projection. No evidence of displacement or intra-articular involvement. No dislocation. IMPRESSION: Possible nondisplaced fracture, surgical neck humerus. Electronically Signed   By: Lucrezia Europe M.D.   On: 08/15/2022 20:15    Procedures Procedures    Medications Ordered in ED Medications - No data to display  ED Course/ Medical Decision Making/ A&P                           Medical Decision Making Amount and/or Complexity of Data Reviewed Independent Historian: EMS Labs: ordered. Decision-making details documented in ED Course. Radiology: ordered and independent interpretation performed. Decision-making details documented in ED Course. ECG/medicine tests: ordered and independent interpretation performed. Decision-making details documented in ED Course.  Risk Prescription drug management.  Pop in right shoulder after falling.  No head  injury.  Neurovascularly intact but unable to range arm.  No obvious dislocation on exam but will check x-ray  X-ray negative for dislocation.  Does show questionable surgical neck fracture.  Will obtain CT scan.  Results reviewed interpreted by me.  CT does confirm surgical neck fracture extending through greater tuberosity.  Nondisplaced.  Discussed with orthopedics on-call for Dr. Ninfa Linden, Dr. Sammuel Hines.   He agrees with shoulder immobilizer, pain control and follow-up with Dr. Ninfa Linden. Return precautions discussed.         Final Clinical Impression(s) / ED Diagnoses Final diagnoses:  Closed nondisplaced fracture of surgical neck of right humerus, unspecified fracture morphology, initial encounter    Rx / DC Orders ED Discharge Orders     None         Jannae Fagerstrom, Annie Main, MD 08/16/22 0007

## 2022-08-15 NOTE — Discharge Instructions (Addendum)
There is a fracture to the proximal part of your humerus called the surgical neck.  Wear the shoulder immobilizer and take the pain medication as prescribed.  Follow-up with Dr. Ninfa Linden for an appointment in the next 2 or 3 days.  Return to the ED with new or worsening symptoms.

## 2022-08-15 NOTE — ED Triage Notes (Signed)
Pt was at her job and fell. On the way down she grabbed a door and felt a pop in her right shoulder. PT has not been able to move her right arm since.

## 2022-08-20 ENCOUNTER — Ambulatory Visit (INDEPENDENT_AMBULATORY_CARE_PROVIDER_SITE_OTHER): Payer: Worker's Compensation | Admitting: Orthopaedic Surgery

## 2022-08-20 ENCOUNTER — Encounter: Payer: Self-pay | Admitting: Orthopaedic Surgery

## 2022-08-20 ENCOUNTER — Ambulatory Visit: Payer: 59 | Admitting: Physician Assistant

## 2022-08-20 DIAGNOSIS — S42293A Other displaced fracture of upper end of unspecified humerus, initial encounter for closed fracture: Secondary | ICD-10-CM | POA: Insufficient documentation

## 2022-08-20 DIAGNOSIS — S42291S Other displaced fracture of upper end of right humerus, sequela: Secondary | ICD-10-CM | POA: Insufficient documentation

## 2022-08-20 DIAGNOSIS — S42291A Other displaced fracture of upper end of right humerus, initial encounter for closed fracture: Secondary | ICD-10-CM

## 2022-08-20 DIAGNOSIS — S42291D Other displaced fracture of upper end of right humerus, subsequent encounter for fracture with routine healing: Secondary | ICD-10-CM | POA: Insufficient documentation

## 2022-08-20 DIAGNOSIS — M25511 Pain in right shoulder: Secondary | ICD-10-CM

## 2022-08-20 MED ORDER — OXYCODONE-ACETAMINOPHEN 5-325 MG PO TABS: 1.0000 | ORAL_TABLET | Freq: Four times a day (QID) | ORAL | 0 refills | Status: DC | PRN
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
## 2022-08-21 ENCOUNTER — Ambulatory Visit: Payer: 59 | Admitting: Orthopaedic Surgery

## 2022-08-26 ENCOUNTER — Ambulatory Visit: Payer: 59 | Admitting: Physician Assistant

## 2022-08-29 ENCOUNTER — Encounter: Payer: Self-pay | Admitting: Orthopaedic Surgery

## 2022-08-29 ENCOUNTER — Ambulatory Visit (INDEPENDENT_AMBULATORY_CARE_PROVIDER_SITE_OTHER): Payer: 59 | Admitting: Orthopaedic Surgery

## 2022-08-29 ENCOUNTER — Ambulatory Visit (INDEPENDENT_AMBULATORY_CARE_PROVIDER_SITE_OTHER): Payer: Worker's Compensation

## 2022-08-29 DIAGNOSIS — S42291A Other displaced fracture of upper end of right humerus, initial encounter for closed fracture: Secondary | ICD-10-CM
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
## 2022-09-12 ENCOUNTER — Ambulatory Visit (INDEPENDENT_AMBULATORY_CARE_PROVIDER_SITE_OTHER): Payer: Worker's Compensation

## 2022-09-12 ENCOUNTER — Ambulatory Visit (INDEPENDENT_AMBULATORY_CARE_PROVIDER_SITE_OTHER): Payer: Worker's Compensation | Admitting: Orthopaedic Surgery

## 2022-09-12 ENCOUNTER — Ambulatory Visit (INDEPENDENT_AMBULATORY_CARE_PROVIDER_SITE_OTHER): Payer: 59 | Admitting: Nurse Practitioner

## 2022-09-12 ENCOUNTER — Encounter: Payer: Self-pay | Admitting: Orthopaedic Surgery

## 2022-09-12 VITALS — BP 132/84 | HR 80 | Ht 63.0 in | Wt 183.0 lb

## 2022-09-12 DIAGNOSIS — E538 Deficiency of other specified B group vitamins: Secondary | ICD-10-CM | POA: Diagnosis not present

## 2022-09-12 DIAGNOSIS — S42291A Other displaced fracture of upper end of right humerus, initial encounter for closed fracture: Secondary | ICD-10-CM

## 2022-09-12 DIAGNOSIS — S42291D Other displaced fracture of upper end of right humerus, subsequent encounter for fracture with routine healing: Secondary | ICD-10-CM

## 2022-09-12 MED ORDER — CYANOCOBALAMIN 1000 MCG/ML IJ SOLN
1000.0000 ug | INTRAMUSCULAR | Status: AC
  Administered 2022-09-12 – 2022-11-14 (×3): 1000 ug via INTRAMUSCULAR

## 2022-09-12 NOTE — Addendum Note (Signed)
Addended by: Pennelope Bracken on: 09/12/2022 09:42 AM   Modules accepted: Orders

## 2022-09-12 NOTE — Progress Notes (Signed)
  Vanessa Shah is here for a vitamin B 12 injection. Denies muscle cramps, weakness or irregular heart rate.  Patient tolerated injection well without complications. Patient advised to schedule next injection 30 days from today.
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
## 2022-09-18 ENCOUNTER — Ambulatory Visit: Payer: 59 | Admitting: Rehabilitative and Restorative Service Providers"

## 2022-09-24 ENCOUNTER — Ambulatory Visit: Payer: 59 | Admitting: Orthopaedic Surgery

## 2022-09-27 ENCOUNTER — Other Ambulatory Visit (HOSPITAL_BASED_OUTPATIENT_CLINIC_OR_DEPARTMENT_OTHER): Payer: Self-pay | Admitting: Nurse Practitioner

## 2022-09-27 DIAGNOSIS — F411 Generalized anxiety disorder: Secondary | ICD-10-CM

## 2022-09-27 DIAGNOSIS — F329 Major depressive disorder, single episode, unspecified: Secondary | ICD-10-CM

## 2022-10-08 ENCOUNTER — Ambulatory Visit (INDEPENDENT_AMBULATORY_CARE_PROVIDER_SITE_OTHER): Payer: Worker's Compensation | Admitting: Orthopaedic Surgery

## 2022-10-08 ENCOUNTER — Encounter: Payer: Self-pay | Admitting: Orthopaedic Surgery

## 2022-10-08 DIAGNOSIS — S42291D Other displaced fracture of upper end of right humerus, subsequent encounter for fracture with routine healing: Secondary | ICD-10-CM
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
## 2022-10-14 ENCOUNTER — Ambulatory Visit (INDEPENDENT_AMBULATORY_CARE_PROVIDER_SITE_OTHER): Payer: 59 | Admitting: Family Medicine

## 2022-10-14 ENCOUNTER — Telehealth: Payer: Self-pay | Admitting: Orthopaedic Surgery

## 2022-10-14 DIAGNOSIS — E538 Deficiency of other specified B group vitamins: Secondary | ICD-10-CM

## 2022-10-14 NOTE — Telephone Encounter (Signed)
shannon  from [facility] best contact.  [phone]  wants patient progress with active range of motion. Can you clarify what her work restrictions are at this time

## 2022-10-14 NOTE — Progress Notes (Signed)
Pt was here on 10/422. She received a B-12 injection in the left Ventrogluteal . Pt tolerated injection well and she will return back in 1 month for another B-12 injection.

## 2022-10-15 ENCOUNTER — Telehealth (HOSPITAL_BASED_OUTPATIENT_CLINIC_OR_DEPARTMENT_OTHER): Payer: Self-pay | Admitting: Family Medicine

## 2022-10-15 DIAGNOSIS — F5101 Primary insomnia: Secondary | ICD-10-CM

## 2022-10-15 NOTE — Telephone Encounter (Signed)
Please call- active and passive ROM S/P proximal humerus fracture-OK to be aggressive

## 2022-10-15 NOTE — Telephone Encounter (Signed)
Pt called and stated that she is having a hard time with the home deliver pharmacy and is wishing for medication to be sent into normal pharmacy to be picked up. Pt aware of process for this.  zolpidem (AMBIEN CR) 12.5 MG CR tablet [[id number]]   [id number] - , [place] - [address]   Please advise.

## 2022-10-15 NOTE — Telephone Encounter (Signed)
I called Sheria Lang at [facility] and advised.

## 2022-10-15 NOTE — Telephone Encounter (Signed)
I pulled number from Media tab, patient is being seen at [facility] location. I advised of orders. They would like to know if patient has any work restrictions. Last work note entered was for light duty on 08/29/2022 and continue sling. Can you advise?  CB  [phone]

## 2022-10-18 MED ORDER — ZOLPIDEM TARTRATE ER 12.5 MG PO TBCR: 12.5000 mg | EXTENDED_RELEASE_TABLET | Freq: Every evening | ORAL | 0 refills | Status: DC | PRN

## 2022-10-22 ENCOUNTER — Encounter (HOSPITAL_BASED_OUTPATIENT_CLINIC_OR_DEPARTMENT_OTHER): Payer: Self-pay

## 2022-10-22 ENCOUNTER — Encounter: Payer: Self-pay | Admitting: Orthopaedic Surgery

## 2022-10-22 ENCOUNTER — Ambulatory Visit (INDEPENDENT_AMBULATORY_CARE_PROVIDER_SITE_OTHER): Payer: Worker's Compensation | Admitting: Orthopaedic Surgery

## 2022-10-22 DIAGNOSIS — S42291D Other displaced fracture of upper end of right humerus, subsequent encounter for fracture with routine healing: Secondary | ICD-10-CM
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
## 2022-10-22 NOTE — Telephone Encounter (Signed)
Sent pt a MyChart message letting her know if she wishes to stay at [facility] then she will need to make an appt. If she wanted to follow [facility] then we could give her information.

## 2022-11-07 ENCOUNTER — Ambulatory Visit (INDEPENDENT_AMBULATORY_CARE_PROVIDER_SITE_OTHER): Payer: Worker's Compensation | Admitting: Orthopaedic Surgery

## 2022-11-07 ENCOUNTER — Ambulatory Visit (INDEPENDENT_AMBULATORY_CARE_PROVIDER_SITE_OTHER): Payer: Worker's Compensation

## 2022-11-07 ENCOUNTER — Encounter: Payer: Self-pay | Admitting: Orthopaedic Surgery

## 2022-11-07 VITALS — Ht 69.0 in | Wt 180.0 lb

## 2022-11-07 DIAGNOSIS — S42291D Other displaced fracture of upper end of right humerus, subsequent encounter for fracture with routine healing: Secondary | ICD-10-CM
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
## 2022-11-14 ENCOUNTER — Ambulatory Visit (INDEPENDENT_AMBULATORY_CARE_PROVIDER_SITE_OTHER): Payer: BC Managed Care – PPO | Admitting: Family Medicine

## 2022-11-14 DIAGNOSIS — E538 Deficiency of other specified B group vitamins: Secondary | ICD-10-CM

## 2022-11-14 NOTE — Progress Notes (Signed)
Pt was here for b12 injection. Pt tolerated b12 injection well. Pt comes every month to receive one.

## 2022-11-28 ENCOUNTER — Ambulatory Visit (INDEPENDENT_AMBULATORY_CARE_PROVIDER_SITE_OTHER): Payer: Worker's Compensation

## 2022-11-28 ENCOUNTER — Encounter: Payer: Self-pay | Admitting: Physician Assistant

## 2022-11-28 ENCOUNTER — Ambulatory Visit (INDEPENDENT_AMBULATORY_CARE_PROVIDER_SITE_OTHER): Payer: Worker's Compensation | Admitting: Physician Assistant

## 2022-11-28 DIAGNOSIS — S42291D Other displaced fracture of upper end of right humerus, subsequent encounter for fracture with routine healing: Secondary | ICD-10-CM

## 2022-11-28 DIAGNOSIS — S42291S Other displaced fracture of upper end of right humerus, sequela: Secondary | ICD-10-CM
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
## 2022-12-16 ENCOUNTER — Other Ambulatory Visit: Payer: Self-pay | Admitting: Family Medicine

## 2022-12-16 ENCOUNTER — Ambulatory Visit (HOSPITAL_BASED_OUTPATIENT_CLINIC_OR_DEPARTMENT_OTHER): Payer: 59

## 2022-12-16 DIAGNOSIS — E538 Deficiency of other specified B group vitamins: Secondary | ICD-10-CM

## 2022-12-17 ENCOUNTER — Ambulatory Visit (HOSPITAL_BASED_OUTPATIENT_CLINIC_OR_DEPARTMENT_OTHER): Payer: BC Managed Care – PPO

## 2022-12-17 DIAGNOSIS — E538 Deficiency of other specified B group vitamins: Secondary | ICD-10-CM | POA: Diagnosis not present

## 2022-12-18 ENCOUNTER — Ambulatory Visit (INDEPENDENT_AMBULATORY_CARE_PROVIDER_SITE_OTHER): Payer: Worker's Compensation | Admitting: Orthopaedic Surgery

## 2022-12-18 ENCOUNTER — Telehealth: Payer: Self-pay

## 2022-12-18 ENCOUNTER — Other Ambulatory Visit: Payer: Self-pay

## 2022-12-18 ENCOUNTER — Encounter: Payer: Self-pay | Admitting: Orthopaedic Surgery

## 2022-12-18 DIAGNOSIS — S42291S Other displaced fracture of upper end of right humerus, sequela: Secondary | ICD-10-CM

## 2022-12-18 DIAGNOSIS — S42291D Other displaced fracture of upper end of right humerus, subsequent encounter for fracture with routine healing: Secondary | ICD-10-CM | POA: Diagnosis not present

## 2022-12-18 LAB — B12 AND FOLATE PANEL
Folate: 4.3 ng/mL (ref >3.0)
Vitamin B-12: 425 pg/mL (ref 232–1245)

## 2022-12-18 NOTE — Telephone Encounter (Signed)
I just put in two referrals, one for PT and the other for [facility] She is WC but since they aren't approving anything we suggested she just use her own insurance  Just FYI?

## 2022-12-18 NOTE — Progress Notes (Signed)
The patient is being seen in follow-up for a right shoulder proximal humerus fracture that occurred in October of last year.  The fracture was minimally displaced and is essentially healed.  This was after mechanical fall.  Her last x-rays recently 3 weeks ago showed the fracture to heal completely.  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  She has not had therapy now in a while.  She only had 6 sessions and few of those were cut short.  I did review the x-rays of right shoulder and the fracture is healed completely.  Examination of her right shoulder shows just some slight arthrofibrosis in terms of some decreased forward flexion and abduction as well as external rotation.  Regardless of her Worker's Compensation status, we need to make sure she has the best outcome as possible for her shoulder.  I am recommending physical therapy upstairs here who can be more receptive on getting her shoulder moving better.  Also feel that she would benefit from an intra-articular steroid injection under ultrasound and just an ultrasound assessment of the shoulder in terms of the rotator cuff by Dr. Rolena Infante so we can maximize her outcome.  She has a chance of having a full recovery of her shoulder with appropriate interventions such as an intra-articular steroid injection and aggressive physical therapy on her shoulder.  We will work on getting the set up from her standpoint and she may want to follow this through her own insurance while the [employer] issues are being addressed.  She may need to get a different Worker's Compensation attorney involved as well.  I will see her back in 4 weeks to see how she is doing overall but no x-rays are needed.

## 2022-12-20 NOTE — Telephone Encounter (Signed)
FYI

## 2022-12-25 ENCOUNTER — Ambulatory Visit: Payer: 59 | Admitting: Sports Medicine

## 2023-01-02 ENCOUNTER — Ambulatory Visit: Payer: BC Managed Care – PPO | Admitting: Rehabilitative and Restorative Service Providers"

## 2023-01-14 ENCOUNTER — Telehealth: Payer: Self-pay

## 2023-01-14 NOTE — Telephone Encounter (Signed)
Did this patient every see anyone for a shoulder injection? Because it doesn't look like she was seen by Rolena Infante like we requested

## 2023-01-15 ENCOUNTER — Ambulatory Visit: Payer: BC Managed Care – PPO | Admitting: Orthopaedic Surgery

## 2023-01-16 ENCOUNTER — Encounter: Payer: Self-pay | Admitting: Radiology

## 2023-01-23 ENCOUNTER — Telehealth (HOSPITAL_BASED_OUTPATIENT_CLINIC_OR_DEPARTMENT_OTHER): Payer: Self-pay

## 2023-01-23 DIAGNOSIS — F5101 Primary insomnia: Secondary | ICD-10-CM

## 2023-01-23 NOTE — Telephone Encounter (Signed)
Pt called and stated she need a refill on her Zolpidem er 12.5 mg. She would like for it to be sent to the pharmacy on file.

## 2023-01-28 MED ORDER — ZOLPIDEM TARTRATE ER 12.5 MG PO TBCR: 12.5000 mg | EXTENDED_RELEASE_TABLET | Freq: Every evening | ORAL | 0 refills | Status: DC | PRN

## 2023-01-28 NOTE — Addendum Note (Signed)
Addended by: DE [place], Dedee Liss J on: 01/28/2023 10:47 AM   Modules accepted: Orders

## 2023-02-05 ENCOUNTER — Ambulatory Visit (INDEPENDENT_AMBULATORY_CARE_PROVIDER_SITE_OTHER): Payer: 59 | Admitting: Family Medicine

## 2023-02-05 ENCOUNTER — Encounter (HOSPITAL_BASED_OUTPATIENT_CLINIC_OR_DEPARTMENT_OTHER): Payer: Self-pay | Admitting: Family Medicine

## 2023-02-05 VITALS — BP 171/99 | HR 56 | Temp 97.9°F | Ht 69.0 in | Wt 189.0 lb

## 2023-02-05 DIAGNOSIS — F411 Generalized anxiety disorder: Secondary | ICD-10-CM | POA: Diagnosis not present

## 2023-02-05 DIAGNOSIS — Z0000 Encounter for general adult medical examination without abnormal findings: Secondary | ICD-10-CM | POA: Insufficient documentation

## 2023-02-05 DIAGNOSIS — F329 Major depressive disorder, single episode, unspecified: Secondary | ICD-10-CM

## 2023-02-05 MED ORDER — CLONAZEPAM 1 MG PO TABS: 0.5000 mg | ORAL_TABLET | Freq: Every day | ORAL | 0 refills | Status: DC | PRN
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
## 2023-02-05 NOTE — Assessment & Plan Note (Addendum)
Routine HCM labs reviewed. HCM reviewed/discussed. Anticipatory guidance regarding healthy weight, lifestyle and choices given. Recommend healthy diet.  Recommend approximately 150 minutes/week of moderate intensity exercise Recommend regular dental and vision exams Always use seatbelt/lap and shoulder restraints Recommend using smoke alarms and checking batteries at least twice a year Recommend using sunscreen when outside Discussed colon cancer screening recommendations, options.  Patient is UTD Discussed recommendations for shingles vaccine.  Patient reports having received first dose, needs second Discussed tetanus immunization recommendations, patient agreed to proceed with this today

## 2023-02-25 ENCOUNTER — Other Ambulatory Visit (HOSPITAL_BASED_OUTPATIENT_CLINIC_OR_DEPARTMENT_OTHER): Payer: Self-pay | Admitting: Nurse Practitioner

## 2023-02-25 DIAGNOSIS — F329 Major depressive disorder, single episode, unspecified: Secondary | ICD-10-CM

## 2023-02-25 DIAGNOSIS — F411 Generalized anxiety disorder: Secondary | ICD-10-CM

## 2023-03-31 ENCOUNTER — Other Ambulatory Visit (HOSPITAL_BASED_OUTPATIENT_CLINIC_OR_DEPARTMENT_OTHER): Payer: Self-pay | Admitting: Family Medicine

## 2023-03-31 DIAGNOSIS — F329 Major depressive disorder, single episode, unspecified: Secondary | ICD-10-CM

## 2023-04-04 ENCOUNTER — Telehealth (HOSPITAL_BASED_OUTPATIENT_CLINIC_OR_DEPARTMENT_OTHER): Payer: Self-pay | Admitting: Family Medicine

## 2023-04-04 DIAGNOSIS — F5101 Primary insomnia: Secondary | ICD-10-CM

## 2023-04-04 NOTE — Telephone Encounter (Signed)
Patient said RX wasn't approved by doctor for Zolpdiem wanting to know why?

## 2023-04-08 MED ORDER — ZOLPIDEM TARTRATE ER 12.5 MG PO TBCR: 12.5000 mg | EXTENDED_RELEASE_TABLET | Freq: Every evening | ORAL | 0 refills | Status: DC | PRN

## 2023-04-08 NOTE — Telephone Encounter (Signed)
Zolpdiem why denied per patient would like a callback.

## 2023-04-08 NOTE — Telephone Encounter (Signed)
Pt was called in regards to prescription being sent in to the pharmacy.

## 2023-05-12 ENCOUNTER — Other Ambulatory Visit (HOSPITAL_BASED_OUTPATIENT_CLINIC_OR_DEPARTMENT_OTHER): Payer: Self-pay | Admitting: Family Medicine

## 2023-05-12 DIAGNOSIS — F5101 Primary insomnia: Secondary | ICD-10-CM

## 2023-05-12 NOTE — Telephone Encounter (Signed)
Please advise on refill request

## 2023-06-11 ENCOUNTER — Other Ambulatory Visit (HOSPITAL_BASED_OUTPATIENT_CLINIC_OR_DEPARTMENT_OTHER): Payer: Self-pay | Admitting: Family Medicine

## 2023-06-11 DIAGNOSIS — F5101 Primary insomnia: Secondary | ICD-10-CM

## 2023-06-11 NOTE — Telephone Encounter (Signed)
Please advise on refill request

## 2023-06-16 ENCOUNTER — Telehealth (HOSPITAL_BASED_OUTPATIENT_CLINIC_OR_DEPARTMENT_OTHER): Payer: Self-pay | Admitting: Family Medicine

## 2023-06-16 NOTE — Telephone Encounter (Signed)
Patient states every month she has to call in because [facility] will not fill nor contact us to refill. She doesn't understand why no refills on this medication. She would like a call back and please refill medication. Zolpidem (ambien cr) 12.5 mg CR tablet.  She is leaving country Wednesday 8/7 and needs refill asap

## 2023-07-17 ENCOUNTER — Telehealth (HOSPITAL_BASED_OUTPATIENT_CLINIC_OR_DEPARTMENT_OTHER): Payer: Self-pay | Admitting: Family Medicine

## 2023-07-17 DIAGNOSIS — F5101 Primary insomnia: Secondary | ICD-10-CM

## 2023-07-17 NOTE — Telephone Encounter (Signed)
Prescription Request  07/17/2023  LOV: 02/05/2023  What is the name of the medication or equipment?  zolpidem (AMBIEN CR) 12.5 MG CR tablet  Have you contacted your pharmacy to request a refill? Yes   Which pharmacy would you like this sent to?  [id number] [place], [place] - [address] [facility] [address] [facility] [address] [place] [address] Phone: [phone] Fax: [phone]      Patient notified that their request is being sent to the clinical staff for review and that they should receive a response within 2 business days.   Please advise at [facility] [phone]

## 2023-07-18 MED ORDER — ZOLPIDEM TARTRATE ER 12.5 MG PO TBCR: 12.5000 mg | EXTENDED_RELEASE_TABLET | Freq: Every evening | ORAL | 0 refills | Status: DC | PRN

## 2023-08-08 ENCOUNTER — Ambulatory Visit (HOSPITAL_BASED_OUTPATIENT_CLINIC_OR_DEPARTMENT_OTHER): Payer: 59 | Admitting: Family Medicine

## 2023-08-18 ENCOUNTER — Encounter (HOSPITAL_BASED_OUTPATIENT_CLINIC_OR_DEPARTMENT_OTHER): Payer: Self-pay | Admitting: Family Medicine

## 2023-08-18 ENCOUNTER — Telehealth (HOSPITAL_BASED_OUTPATIENT_CLINIC_OR_DEPARTMENT_OTHER): Payer: BC Managed Care – PPO | Admitting: Family Medicine

## 2023-08-18 DIAGNOSIS — F411 Generalized anxiety disorder: Secondary | ICD-10-CM | POA: Diagnosis not present

## 2023-08-18 DIAGNOSIS — F329 Major depressive disorder, single episode, unspecified: Secondary | ICD-10-CM

## 2023-08-18 DIAGNOSIS — F5101 Primary insomnia: Secondary | ICD-10-CM

## 2023-08-18 MED ORDER — ZOLPIDEM TARTRATE ER 12.5 MG PO TBCR: 12.5000 mg | EXTENDED_RELEASE_TABLET | Freq: Every evening | ORAL | 0 refills | Status: DC | PRN

## 2023-08-18 MED ORDER — CLONAZEPAM 1 MG PO TABS: 0.5000 mg | ORAL_TABLET | Freq: Every day | ORAL | 0 refills | Status: DC | PRN

## 2023-08-18 NOTE — Progress Notes (Signed)
Virtual Visit   I connected with  Vanessa Shah  on 08/18/23 by telephone/telehealth and verified that I am speaking with the correct person using two identifiers. Visit completed via video.  I discussed the limitations, risks, security and privacy concerns of performing an evaluation and management service by telephone, including the higher likelihood of inaccurate diagnosis and treatment, and the availability of in person appointments.  We also discussed the likely need of an additional face to face encounter for complete and high quality delivery of care.  I also discussed with the patient that there may be a patient responsible charge related to this service. The patient expressed understanding and wishes to proceed.  Provider location is in medical facility. Patient location is at their home, different from provider location. People involved in care of the patient during this telehealth encounter were myself, my nurse/medical assistant, and my front office/scheduling team member.  Review of Systems: No fevers, chills, night sweats, weight loss, chest pain, or shortness of breath.  Objective Findings:    General: Speaking full sentences, no audible heavy breathing.  Sounds alert and appropriately interactive.  Independent interpretation of tests performed by another provider:   None.  Brief History, Exam, Impression, and Recommendations:    GAD (generalized anxiety disorder) Patient currently manages with Wellbutrin and Prozac with use of Klonopin as needed.  Tends to use Klonopin on a fairly regular basis.  However, patient has been without medication for about 1 month now.  She has noted increased anxiety symptoms as well as depressive symptoms.  We have previously discussed possibility of psychiatry referral, however at last appointment patient was transitioning between jobs and was going to be having changes in insurance.  She has engaged in counseling in the past, however  not currently doing so.  She is requesting refill of Klonopin today. We discussed considerations today.  Again reviewed octyl medication management, particularly related to decreased reliance on benzodiazepine.  I do feel that patient would be best served by having further evaluation with psychiatry to optimize medication management.  Additionally, patient would likely benefit from counseling in conjunction with medication management.  We will proceed with referral to [facility] initially for referral to psychiatry.  [facility] also has counseling services available there, thus would also consider pursuing this within the same office. PDMP reviewed today, no red flags.  Can proceed with refill of Klonopin at this time.  Again discussed potential risk and side effects related to this medication and in particular related to long-term use  Primary insomnia Patient continues with use of Ambien to assist with insomnia management.  She has not noticed any adverse effects from medication.  She is requesting refill today.  PDMP reviewed, no red flags observed.  Can proceed with refill of Ambien at this time.  Again discussed potential risks and adverse effects related to medication.  Also cautioned on use of this medication with other medications patient is taking, in particular benzodiazepine.  I discussed the above assessment and treatment plan with the patient. The patient was provided an opportunity to ask questions and all were answered. The patient agreed with the plan and demonstrated an understanding of the instructions.  The patient was advised to call back or seek an in-person evaluation if the symptoms worsen or if the condition fails to improve as anticipated.  I provided 14 minutes of face to face and non-face-to-face time during this encounter date, time was needed to gather information, review chart, records, communicate/coordinate with staff  remotely, as well as complete  documentation.   ___________________________________________ Eland Lamantia de Peru, MD, ABFM, CAQSM Primary Care and Sports Medicine [facility]

## 2023-08-18 NOTE — Assessment & Plan Note (Addendum)
Patient currently manages with Wellbutrin and Prozac with use of Klonopin as needed.  Tends to use Klonopin on a fairly regular basis.  However, patient has been without medication for about 1 month now.  She has noted increased anxiety symptoms as well as depressive symptoms.  We have previously discussed possibility of psychiatry referral, however at last appointment patient was transitioning between jobs and was going to be having changes in insurance.  She has engaged in counseling in the past, however not currently doing so.  She is requesting refill of Klonopin today. We discussed considerations today.  Again reviewed octyl medication management, particularly related to decreased reliance on benzodiazepine.  I do feel that patient would be best served by having further evaluation with psychiatry to optimize medication management.  Additionally, patient would likely benefit from counseling in conjunction with medication management.  We will proceed with referral to [facility] initially for referral to psychiatry.  [facility] also has counseling services available there, thus would also consider pursuing this within the same office. PDMP reviewed today, no red flags.  Can proceed with refill of Klonopin at this time.  Again discussed potential risk and side effects related to this medication and in particular related to long-term use

## 2023-08-18 NOTE — Assessment & Plan Note (Signed)
Patient continues with use of Ambien to assist with insomnia management.  She has not noticed any adverse effects from medication.  She is requesting refill today.  PDMP reviewed, no red flags observed.  Can proceed with refill of Ambien at this time.  Again discussed potential risks and adverse effects related to medication.  Also cautioned on use of this medication with other medications patient is taking, in particular benzodiazepine.

## 2023-08-21 ENCOUNTER — Ambulatory Visit: Payer: Self-pay | Admitting: Sports Medicine

## 2023-08-25 ENCOUNTER — Ambulatory Visit: Payer: Self-pay | Admitting: Sports Medicine

## 2023-08-26 ENCOUNTER — Other Ambulatory Visit: Payer: Self-pay

## 2023-08-26 ENCOUNTER — Other Ambulatory Visit (INDEPENDENT_AMBULATORY_CARE_PROVIDER_SITE_OTHER): Payer: Worker's Compensation

## 2023-08-26 ENCOUNTER — Encounter: Payer: Self-pay | Admitting: Sports Medicine

## 2023-08-26 ENCOUNTER — Ambulatory Visit (INDEPENDENT_AMBULATORY_CARE_PROVIDER_SITE_OTHER): Payer: Worker's Compensation | Admitting: Sports Medicine

## 2023-08-26 DIAGNOSIS — M25511 Pain in right shoulder: Secondary | ICD-10-CM

## 2023-08-26 DIAGNOSIS — S42291S Other displaced fracture of upper end of right humerus, sequela: Secondary | ICD-10-CM | POA: Diagnosis not present

## 2023-08-26 DIAGNOSIS — G8929 Other chronic pain: Secondary | ICD-10-CM

## 2023-08-26 MED ORDER — LIDOCAINE HCL 1 % IJ SOLN
2.0000 mL | INTRAMUSCULAR | Status: AC | PRN
  Administered 2023-08-26: 2 mL

## 2023-08-26 MED ORDER — METHYLPREDNISOLONE ACETATE 40 MG/ML IJ SUSP
80.0000 mg | INTRAMUSCULAR | Status: AC | PRN
  Administered 2023-08-26: 80 mg via INTRA_ARTICULAR

## 2023-08-26 MED ORDER — BUPIVACAINE HCL 0.25 % IJ SOLN
2.0000 mL | INTRAMUSCULAR | Status: AC | PRN
  Administered 2023-08-26: 2 mL via INTRA_ARTICULAR
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
## 2023-09-10 ENCOUNTER — Ambulatory Visit: Payer: Self-pay | Admitting: Orthopaedic Surgery

## 2023-09-17 ENCOUNTER — Other Ambulatory Visit (INDEPENDENT_AMBULATORY_CARE_PROVIDER_SITE_OTHER): Payer: BC Managed Care – PPO

## 2023-09-17 ENCOUNTER — Ambulatory Visit (INDEPENDENT_AMBULATORY_CARE_PROVIDER_SITE_OTHER): Payer: Worker's Compensation | Admitting: Orthopaedic Surgery

## 2023-09-17 DIAGNOSIS — G8929 Other chronic pain: Secondary | ICD-10-CM | POA: Diagnosis not present

## 2023-09-17 DIAGNOSIS — M25511 Pain in right shoulder: Secondary | ICD-10-CM

## 2023-09-17 NOTE — Progress Notes (Signed)
The patient is now just over a year out from a mechanical fall that was work-related in which she injured her right shoulder.  She sustained an extra-articular proximal humerus fracture.  Subsequent x-rays showed the fracture and healed.  However she has not had significant limitations in terms of range of motion of that shoulder.  When we saw her back in February of this year we had recommended outpatient physical therapy combined with an intra-articular steroid injection of the shoulder.  She has still been on light duty work and she does report still decreased motion of her right shoulder that is detrimentally affecting her actives daily living.  She cannot lift anything of significant weight and feels weak.  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  She has a case manager fortunately with her today as well.  She did have an intra-articular steroid injection last month by Dr. Shon Baton in that right shoulder joint.  X-rays of the right shoulder today show the fracture is completely healed.  There is no arthritic changes that can be seen on plain films.  The humeral head is in appropriate position as well.  On exam she can abduct her shoulder on her own on the right side and it does not appear that there is deficit of the rotator cuff on just abduction.  Passively her range of motion is almost entirely full.  I can even put her arm behind her to the upper lumbar spine to a much thoracic level but passively she has a significant struggle doing this.  Her external rotation is almost full and her forward flexion and abduction are almost full.  She does exhibit pain when I push this hard passively.  I would like to send her to outpatient formal physical therapy to see if they can help improve her shoulder function.  We will keep her on light duty work with no lifting greater than 10 pounds for the next 6 weeks.  Will reevaluate in 6 weeks.  If she continues to have  reported shoulder weakness and pain, we would recommend a MRI of her right shoulder at that visit if appropriate.  All question concerns were addressed and answered.

## 2023-09-26 ENCOUNTER — Ambulatory Visit (HOSPITAL_BASED_OUTPATIENT_CLINIC_OR_DEPARTMENT_OTHER): Payer: BC Managed Care – PPO

## 2023-09-26 ENCOUNTER — Ambulatory Visit (HOSPITAL_BASED_OUTPATIENT_CLINIC_OR_DEPARTMENT_OTHER): Payer: BC Managed Care – PPO | Admitting: Family Medicine

## 2023-09-26 ENCOUNTER — Encounter (HOSPITAL_BASED_OUTPATIENT_CLINIC_OR_DEPARTMENT_OTHER): Payer: Self-pay | Admitting: Family Medicine

## 2023-09-26 VITALS — BP 125/68 | HR 60 | Ht 69.0 in | Wt 190.7 lb

## 2023-09-26 DIAGNOSIS — M4316 Spondylolisthesis, lumbar region: Secondary | ICD-10-CM | POA: Diagnosis not present

## 2023-09-26 DIAGNOSIS — R109 Unspecified abdominal pain: Secondary | ICD-10-CM | POA: Insufficient documentation

## 2023-09-26 DIAGNOSIS — M545 Low back pain, unspecified: Secondary | ICD-10-CM

## 2023-09-26 DIAGNOSIS — M48061 Spinal stenosis, lumbar region without neurogenic claudication: Secondary | ICD-10-CM | POA: Diagnosis not present

## 2023-09-26 DIAGNOSIS — M5136 Other intervertebral disc degeneration, lumbar region with discogenic back pain only: Secondary | ICD-10-CM | POA: Diagnosis not present

## 2023-09-26 MED ORDER — MELOXICAM 15 MG PO TABS: 15.0000 mg | ORAL_TABLET | Freq: Every day | ORAL | 0 refills | Status: DC

## 2023-09-26 NOTE — Progress Notes (Unsigned)
    Procedures performed today:    None.  Independent interpretation of notes and tests performed by another provider:   None.  Brief History, Exam, Impression, and Recommendations:    BP 125/68 (BP Location: Right Arm, Patient Position: Sitting, Cuff Size: Normal)   Pulse 60   Ht 5\' 9"  (1.753 m)   Wt 190 lb 11.2 oz (86.5 kg)   SpO2 96%   BMI 28.16 kg/m   Left-sided low back pain without sciatica, unspecified chronicity Assessment & Plan: Patient reports that after recent half marathon about 1 month ago, patient noted that she had some left-sided low back pain.  Has had intermittent back pain in the past, however typically would subside on its own and this has persisted.  She has tried topical therapies.  Worse with inactivity, specifically notes worsening after laying in bed.  No associated numbness or tingling, no saddle anesthesia, no changes in bowel or bladder habits. On exam, Lumbar spine: Visual inspection without obvious abnormality No tenderness to palpation over spinous processes, mild tenderness palpation through left paraspinal musculature.  Distal neurovascular exam intact. Suspect soft tissue injury.  Discussed with patient.  At this time, can proceed with initial x-ray imaging for further assessment.  Recommend physical therapy, home exercise program as per PT, patient amenable, referral placed.  Can proceed with use of meloxicam, discussed avoidance of other NSAIDs while utilizing prescription meloxicam.  Plan to follow-up in about 2 months to assess progress or sooner as needed  Orders: -     DG Lumbar Spine Complete; Future -     Ambulatory referral to Physical Therapy -     Meloxicam; Take 1 tablet (15 mg total) by mouth daily.  Dispense: 30 tablet; Refill: 0  Abdominal pain, unspecified abdominal location Assessment & Plan: Patient reports associated left-sided abdominal pain.  Pain has been intermittent, no associated changes in bowel movements including  constipation or diarrhea. On exam, vital signs stable, patient in no acute distress.  Abdomen with normal bowel sounds, soft, nontender, nondistended. Uncertain etiology, can proceed with initial laboratory assessment and monitor moving forward.  Orders: -     CBC with Differential/Platelet -     Comprehensive metabolic panel  Return in about 2 months (around 11/26/2023) for back pain.  Spent 32 minutes on this patient encounter, including preparation, chart review, face-to-face counseling with patient and coordination of care, and documentation of encounter   ___________________________________________ Wendy Hoback de Peru, MD, ABFM, [facility] Primary Care and Sports Medicine [facility]

## 2023-09-29 NOTE — Assessment & Plan Note (Signed)
Patient reports associated left-sided abdominal pain.  Pain has been intermittent, no associated changes in bowel movements including constipation or diarrhea. On exam, vital signs stable, patient in no acute distress.  Abdomen with normal bowel sounds, soft, nontender, nondistended. Uncertain etiology, can proceed with initial laboratory assessment and monitor moving forward.

## 2023-09-29 NOTE — Assessment & Plan Note (Signed)
Patient reports that after recent half marathon about 1 month ago, patient noted that she had some left-sided low back pain.  Has had intermittent back pain in the past, however typically would subside on its own and this has persisted.  She has tried topical therapies.  Worse with inactivity, specifically notes worsening after laying in bed.  No associated numbness or tingling, no saddle anesthesia, no changes in bowel or bladder habits. On exam, Lumbar spine: Visual inspection without obvious abnormality No tenderness to palpation over spinous processes, mild tenderness palpation through left paraspinal musculature.  Distal neurovascular exam intact. Suspect soft tissue injury.  Discussed with patient.  At this time, can proceed with initial x-ray imaging for further assessment.  Recommend physical therapy, home exercise program as per PT, patient amenable, referral placed.  Can proceed with use of meloxicam, discussed avoidance of other NSAIDs while utilizing prescription meloxicam.  Plan to follow-up in about 2 months to assess progress or sooner as needed

## 2023-09-30 DIAGNOSIS — R109 Unspecified abdominal pain: Secondary | ICD-10-CM | POA: Diagnosis not present

## 2023-10-01 LAB — CBC WITH DIFFERENTIAL/PLATELET
Basophils Absolute: 0 10*3/uL (ref 0.0–0.2)
Basos: 0 %
EOS (ABSOLUTE): 0.2 10*3/uL (ref 0.0–0.4)
Eos: 3 %
Hematocrit: 40.6 % (ref 34.0–46.6)
Hemoglobin: 13.1 g/dL (ref 11.1–15.9)
Immature Grans (Abs): 0 10*3/uL (ref 0.0–0.1)
Immature Granulocytes: 0 %
Lymphocytes Absolute: 1.9 10*3/uL (ref 0.7–3.1)
Lymphs: 31 %
MCH: 29.8 pg (ref 26.6–33.0)
MCHC: 32.3 g/dL (ref 31.5–35.7)
MCV: 92 fL (ref 79–97)
Monocytes Absolute: 0.6 10*3/uL (ref 0.1–0.9)
Monocytes: 10 %
Neutrophils Absolute: 3.4 10*3/uL (ref 1.4–7.0)
Neutrophils: 56 %
Platelets: 274 10*3/uL (ref 150–450)
RBC: 4.4 x10E6/uL (ref 3.77–5.28)
RDW: 12.9 % (ref 11.7–15.4)
WBC: 6.2 10*3/uL (ref 3.4–10.8)

## 2023-10-01 LAB — COMPREHENSIVE METABOLIC PANEL
ALT: 13 [IU]/L (ref 0–32)
AST: 10 [IU]/L (ref 0–40)
Albumin: 4 g/dL (ref 3.8–4.9)
Alkaline Phosphatase: 84 [IU]/L (ref 44–121)
BUN/Creatinine Ratio: 15 (ref 9–23)
BUN: 13 mg/dL (ref 6–24)
Bilirubin Total: 0.4 mg/dL (ref 0.0–1.2)
CO2: 20 mmol/L (ref 20–29)
Calcium: 8.9 mg/dL (ref 8.7–10.2)
Chloride: 107 mmol/L — ABNORMAL HIGH (ref 96–106)
Creatinine, Ser: 0.89 mg/dL (ref 0.57–1.00)
Globulin, Total: 2.3 g/dL (ref 1.5–4.5)
Glucose: 88 mg/dL (ref 70–99)
Potassium: 4.6 mmol/L (ref 3.5–5.2)
Sodium: 142 mmol/L (ref 134–144)
Total Protein: 6.3 g/dL (ref 6.0–8.5)
eGFR: 77 mL/min/{1.73_m2} (ref >59)

## 2023-10-22 ENCOUNTER — Telehealth (HOSPITAL_BASED_OUTPATIENT_CLINIC_OR_DEPARTMENT_OTHER): Payer: Self-pay | Admitting: *Deleted

## 2023-10-22 DIAGNOSIS — F411 Generalized anxiety disorder: Secondary | ICD-10-CM

## 2023-10-22 DIAGNOSIS — F5101 Primary insomnia: Secondary | ICD-10-CM

## 2023-10-22 DIAGNOSIS — F329 Major depressive disorder, single episode, unspecified: Secondary | ICD-10-CM

## 2023-10-22 MED ORDER — ZOLPIDEM TARTRATE ER 12.5 MG PO TBCR: 12.5000 mg | EXTENDED_RELEASE_TABLET | Freq: Every evening | ORAL | 0 refills | Status: DC | PRN

## 2023-10-22 MED ORDER — CLONAZEPAM 1 MG PO TABS: 0.5000 mg | ORAL_TABLET | Freq: Every day | ORAL | 0 refills | Status: DC | PRN

## 2023-10-22 NOTE — Addendum Note (Signed)
Addended by: DE Peru, Betsaida [place] J on: 10/22/2023 12:37 PM   Modules accepted: Orders

## 2023-10-22 NOTE — Telephone Encounter (Signed)
Copied from CRM [id number]. Topic: Clinical - Medication Refill >> Oct 14, 2023  3:27 PM Joanette Gula wrote: Most Recent Primary Care Visit:  Provider: DE Peru, RAYMOND J  Department: [facility]  Visit Type: OFFICE VISIT  Date: 09/26/2023  Medication: zolpidem (AMBIEN CR) 12.5 MG CR tablet  Has the patient contacted their pharmacy? Yes (Agent: If no, request that the patient contact the pharmacy for the refill. If patient does not wish to contact the pharmacy document the reason why and proceed with request.) (Agent: If yes, when and what did the pharmacy advise?)  Is this the correct pharmacy for this prescription? Yes If no, delete pharmacy and type the correct one.  This is the patient's preferred pharmacy:   [id number] [place], [place] - [address] AT [facility] [address] [facility] [address] [place] [address] Phone: [phone] Fax: [phone]    Has the prescription been filled recently? Yes  Is the patient out of the medication? Yes  Has the patient been seen for an appointment in the last year OR does the patient have an upcoming appointment? Yes  Can we respond through MyChart? Yes  Agent: Please be advised that Rx refills may take up to 3 business days. We ask that you follow-up with your pharmacy.

## 2023-10-22 NOTE — Telephone Encounter (Signed)
Pt requesting refil for zolpidem and clonazepam sent to [facility] on [address]

## 2023-10-29 ENCOUNTER — Other Ambulatory Visit: Payer: Self-pay

## 2023-10-29 ENCOUNTER — Ambulatory Visit (INDEPENDENT_AMBULATORY_CARE_PROVIDER_SITE_OTHER): Payer: Worker's Compensation | Admitting: Orthopaedic Surgery

## 2023-10-29 ENCOUNTER — Encounter: Payer: Self-pay | Admitting: Orthopaedic Surgery

## 2023-10-29 DIAGNOSIS — G8929 Other chronic pain: Secondary | ICD-10-CM

## 2023-10-29 DIAGNOSIS — S42291S Other displaced fracture of upper end of right humerus, sequela: Secondary | ICD-10-CM | POA: Diagnosis not present

## 2023-10-29 DIAGNOSIS — M25511 Pain in right shoulder: Secondary | ICD-10-CM | POA: Diagnosis not present

## 2023-10-29 NOTE — Progress Notes (Signed)
The patient is now over 14 months out from a work-related accident in which she sustained a right humeral head fracture.  The fracture is since healed but she still has limitations with range of motion of her right shoulder and continued pain with the right shoulder.  She also feels like there is weakness of that shoulder.  We had recommended outpatient physical therapy but this was not approved through [employer]. other than a single evaluation visit.  The physical therapist did recommend continued physical therapy.  She has had intra-articular steroid injections in that right shoulder.  She has had time as well as rest and activity modification.  She has tried anti-inflammatories.  On my exam of right shoulder today there is some weakness with external rotation and abduction.  Reaching behind her is still painful for the right shoulder and actively her range of motion is to just barely above the belt line.  Passively I can push her little further but it is painful.  Again, previous x-rays of show the fracture is healed completely and the joint is well located.  At this point a MRI of her right shoulder is warranted to assess the rotator cuff and to rule out any other sequela of her injury from her mechanical work-related fall.  Hopefully we can get this approved and follow-up should be once we have that MRI.  She will continue her current level of light duty work with no lifting greater than 10 pounds and limited overhead activities.

## 2023-10-31 ENCOUNTER — Other Ambulatory Visit (HOSPITAL_BASED_OUTPATIENT_CLINIC_OR_DEPARTMENT_OTHER): Payer: Self-pay | Admitting: *Deleted

## 2023-10-31 DIAGNOSIS — M545 Low back pain, unspecified: Secondary | ICD-10-CM

## 2023-10-31 MED ORDER — MELOXICAM 15 MG PO TABS: 15.0000 mg | ORAL_TABLET | Freq: Every day | ORAL | 0 refills | Status: DC

## 2023-11-24 ENCOUNTER — Other Ambulatory Visit (HOSPITAL_BASED_OUTPATIENT_CLINIC_OR_DEPARTMENT_OTHER): Payer: Self-pay | Admitting: Family Medicine

## 2023-11-24 DIAGNOSIS — F411 Generalized anxiety disorder: Secondary | ICD-10-CM

## 2023-11-24 DIAGNOSIS — F5101 Primary insomnia: Secondary | ICD-10-CM

## 2023-11-24 DIAGNOSIS — F329 Major depressive disorder, single episode, unspecified: Secondary | ICD-10-CM

## 2023-11-24 MED ORDER — ZOLPIDEM TARTRATE ER 12.5 MG PO TBCR: 12.5000 mg | EXTENDED_RELEASE_TABLET | Freq: Every evening | ORAL | 0 refills | Status: DC | PRN

## 2023-11-25 ENCOUNTER — Other Ambulatory Visit (HOSPITAL_BASED_OUTPATIENT_CLINIC_OR_DEPARTMENT_OTHER): Payer: Self-pay | Admitting: Family Medicine

## 2023-11-25 DIAGNOSIS — F329 Major depressive disorder, single episode, unspecified: Secondary | ICD-10-CM

## 2023-11-25 DIAGNOSIS — F5101 Primary insomnia: Secondary | ICD-10-CM

## 2023-11-25 DIAGNOSIS — F411 Generalized anxiety disorder: Secondary | ICD-10-CM

## 2023-11-25 NOTE — Telephone Encounter (Signed)
 Copied from CRM [id number]. Topic: Clinical - Medication Refill >> Nov 25, 2023  2:32 PM Curlee DEL wrote: Most Recent Primary Care Visit:  Provider: DE CUBA, RAYMOND J  Department: [facility]  Visit Type: OFFICE VISIT  Date: 09/26/2023  Medication: zolpidem  (AMBIEN  CR) 12.5 MG CR tablet clonazePAM  (KLONOPIN ) 1 MG tablet    Has the patient contacted their pharmacy? Yes, they stated that they need prior authorization - Please call patient when they've been sent into the pharmacy if possible. (Agent: If no, request that the patient contact the pharmacy for the refill. If patient does not wish to contact the pharmacy document the reason why and proceed with request.) (Agent: If yes, when and what did the pharmacy advise?)  Is this the correct pharmacy for this prescription? Yes If no, delete pharmacy and type the correct one.  This is the patient's preferred pharmacy:  [id number] [place], [place] - [address] AT [facility] [address] [facility] [address] [place] [address] Phone: [phone] Fax: [phone]  [facility] - [place], [place] - [address] [place] [address] Phone: [phone] Fax: [phone]   Has the prescription been filled recently? No  Is the patient out of the medication? Yes  Has the patient been seen for an appointment in the last year OR does the patient have an upcoming appointment? Yes  Can we respond through MyChart? Yes  Agent: Please be advised that Rx refills may take up to 3 business days. We ask that you follow-up with your pharmacy.

## 2023-11-26 ENCOUNTER — Ambulatory Visit (HOSPITAL_BASED_OUTPATIENT_CLINIC_OR_DEPARTMENT_OTHER): Payer: BC Managed Care – PPO | Admitting: Family Medicine

## 2023-12-10 ENCOUNTER — Encounter (HOSPITAL_BASED_OUTPATIENT_CLINIC_OR_DEPARTMENT_OTHER): Payer: Self-pay

## 2023-12-18 ENCOUNTER — Other Ambulatory Visit (HOSPITAL_BASED_OUTPATIENT_CLINIC_OR_DEPARTMENT_OTHER): Payer: Self-pay | Admitting: Family Medicine

## 2023-12-18 ENCOUNTER — Other Ambulatory Visit (HOSPITAL_BASED_OUTPATIENT_CLINIC_OR_DEPARTMENT_OTHER): Payer: Self-pay | Admitting: *Deleted

## 2023-12-18 DIAGNOSIS — F411 Generalized anxiety disorder: Secondary | ICD-10-CM

## 2023-12-18 DIAGNOSIS — F329 Major depressive disorder, single episode, unspecified: Secondary | ICD-10-CM

## 2023-12-18 MED ORDER — CLONAZEPAM 1 MG PO TABS: 0.5000 mg | ORAL_TABLET | Freq: Every day | ORAL | 0 refills | Status: DC | PRN

## 2023-12-23 ENCOUNTER — Encounter (HOSPITAL_BASED_OUTPATIENT_CLINIC_OR_DEPARTMENT_OTHER): Payer: Self-pay | Admitting: Family Medicine

## 2023-12-23 ENCOUNTER — Encounter (HOSPITAL_BASED_OUTPATIENT_CLINIC_OR_DEPARTMENT_OTHER): Payer: Self-pay | Admitting: *Deleted

## 2023-12-23 ENCOUNTER — Ambulatory Visit (HOSPITAL_BASED_OUTPATIENT_CLINIC_OR_DEPARTMENT_OTHER): Payer: 59 | Admitting: Family Medicine

## 2023-12-23 ENCOUNTER — Telehealth (HOSPITAL_BASED_OUTPATIENT_CLINIC_OR_DEPARTMENT_OTHER): Payer: 59 | Admitting: Family Medicine

## 2023-12-23 DIAGNOSIS — Z124 Encounter for screening for malignant neoplasm of cervix: Secondary | ICD-10-CM

## 2023-12-23 DIAGNOSIS — Z0000 Encounter for general adult medical examination without abnormal findings: Secondary | ICD-10-CM

## 2023-12-23 DIAGNOSIS — F411 Generalized anxiety disorder: Secondary | ICD-10-CM

## 2023-12-23 DIAGNOSIS — F329 Major depressive disorder, single episode, unspecified: Secondary | ICD-10-CM

## 2023-12-23 MED ORDER — FLUOXETINE HCL 40 MG PO CAPS: 40.0000 mg | ORAL_CAPSULE | Freq: Every day | ORAL | 1 refills | Status: DC

## 2023-12-23 NOTE — Progress Notes (Unsigned)
   Virtual Visit   I connected with  Vanessa Shah  on 12/23/23 by telephone/telehealth and verified that I am speaking with the correct person using two identifiers. Visit completed via {video/telephone:30368::"video"}.  I discussed the limitations, risks, security and privacy concerns of performing an evaluation and management service by telephone, including the higher likelihood of inaccurate diagnosis and treatment, and the availability of in person appointments.  We also discussed the likely need of an additional face to face encounter for complete and high quality delivery of care.  I also discussed with the patient that there may be a patient responsible charge related to this service. The patient expressed understanding and wishes to proceed.  Provider location is in medical facility. Patient location is at their home, different from provider location. People involved in care of the patient during this telehealth encounter were myself, my nurse/medical assistant, and my front office/scheduling team member.  Review of Systems: No fevers, chills, night sweats, weight loss, chest pain, or shortness of breath.   Objective Findings:    General: Speaking full sentences, no audible heavy breathing.  Sounds alert and appropriately interactive.    Independent interpretation of tests performed by another provider:   None.  Brief History, Exam, Impression, and Recommendations:    No problem-specific Assessment & Plan notes found for this encounter.   I discussed the above assessment and treatment plan with the patient. The patient was provided an opportunity to ask questions and all were answered. The patient agreed with the plan and demonstrated an understanding of the instructions.   The patient was advised to call back or seek an in-person evaluation if the symptoms worsen or if the condition fails to improve as anticipated.   I provided *** minutes of face to face and  non-face-to-face time during this encounter date, time was needed to gather information, review chart, records, communicate/coordinate with staff remotely, as well as complete documentation.   ___________________________________________ Marki Frede de Peru, MD, ABFM, CAQSM Primary Care and Sports Medicine [facility]

## 2023-12-23 NOTE — Assessment & Plan Note (Signed)
Patient currently manages with Wellbutrin and Prozac with use of Klonopin as needed. We have previously placed psychiatry referral, however has not heard from psychiatry office and not scheduled yet.  She has engaged in counseling in the past, however not currently doing so.  She is requesting refill of Prozac today. Previously placed referral to [facility], provided patient with contact info today. Refill of fluoxetine sent to pharmacy PDMP reviewed today, no red flags.

## 2023-12-23 NOTE — Progress Notes (Signed)
   Virtual Visit  I connected with  Vanessa Shah  on 12/23/23 by telehealth and verified that I am speaking with the correct person using two identifiers. Visit completed via video.  I discussed the limitations, risks, security and privacy concerns of performing an evaluation and management service by telephone, including the higher likelihood of inaccurate diagnosis and treatment, and the availability of in person appointments.  We also discussed the likely need of an additional face to face encounter for complete and high quality delivery of care.  I also discussed with the patient that there may be a patient responsible charge related to this service. The patient expressed understanding and wishes to proceed.  Provider location is in medical facility. Patient location is at their home, different from provider location. People involved in care of the patient during this telehealth encounter were myself, my nurse/medical assistant, and my front office/scheduling team member.  Review of Systems: No fevers, chills, night sweats, weight loss, chest pain, or shortness of breath.  Objective Findings:    General: Speaking full sentences, no audible heavy breathing.  Sounds alert and appropriately interactive.  Independent interpretation of tests performed by another provider:   None.  Brief History, Exam, Impression, and Recommendations:    GAD (generalized anxiety disorder) Patient currently manages with Wellbutrin and Prozac with use of Klonopin as needed. We have previously placed psychiatry referral, however has not heard from psychiatry office and not scheduled yet.  She has engaged in counseling in the past, however not currently doing so.  She is requesting refill of Prozac today. Previously placed referral to [facility], provided patient with contact info today. Refill of fluoxetine sent to pharmacy PDMP reviewed today, no red flags.  Reviewed recent labs and imaging.  We  previously did place referral to physical therapy, however patient has not been working with PT related to her back, however she has been working with PT related to other musculoskeletal issues for which she is following with orthopedic surgery.  We will plan for follow-up in about 3 months for physical with labs 1 week prior.  I discussed the above assessment and treatment plan with the patient. The patient was provided an opportunity to ask questions and all were answered. The patient agreed with the plan and demonstrated an understanding of the instructions.  The patient was advised to call back or seek an in-person evaluation if the symptoms worsen or if the condition fails to improve as anticipated.  I provided 12 minutes of face to face and non-face-to-face time during this encounter date, time was needed to gather information, review chart, records, communicate/coordinate with staff remotely, as well as complete documentation.   ___________________________________________ Alesia Oshields de Peru, MD, ABFM, CAQSM Primary Care and Sports Medicine [facility]

## 2024-01-02 ENCOUNTER — Telehealth (HOSPITAL_BASED_OUTPATIENT_CLINIC_OR_DEPARTMENT_OTHER): Payer: Self-pay | Admitting: *Deleted

## 2024-01-02 DIAGNOSIS — F5101 Primary insomnia: Secondary | ICD-10-CM

## 2024-01-02 NOTE — Telephone Encounter (Signed)
 Pt is requesting refill on zlpidem ER 12.5 to be sent to [facility]

## 2024-01-04 MED ORDER — ZOLPIDEM TARTRATE ER 12.5 MG PO TBCR
12.5000 mg | EXTENDED_RELEASE_TABLET | Freq: Every evening | ORAL | 0 refills | Status: DC | PRN
Start: 2024-01-04 — End: 2024-02-04

## 2024-01-04 NOTE — Addendum Note (Signed)
 Addended by: DE Peru, Charlean Carneal J on: 01/04/2024 09:18 PM   Modules accepted: Orders

## 2024-01-12 ENCOUNTER — Ambulatory Visit: Payer: BC Managed Care – PPO | Admitting: Orthopaedic Surgery

## 2024-01-14 ENCOUNTER — Ambulatory Visit (INDEPENDENT_AMBULATORY_CARE_PROVIDER_SITE_OTHER): Payer: Worker's Compensation | Admitting: Orthopaedic Surgery

## 2024-01-14 ENCOUNTER — Encounter: Payer: Self-pay | Admitting: Orthopaedic Surgery

## 2024-01-14 DIAGNOSIS — G8929 Other chronic pain: Secondary | ICD-10-CM

## 2024-01-14 DIAGNOSIS — M25511 Pain in right shoulder: Secondary | ICD-10-CM | POA: Diagnosis not present

## 2024-01-14 MED ORDER — METHYLPREDNISOLONE ACETATE 40 MG/ML IJ SUSP
40.0000 mg | INTRAMUSCULAR | Status: AC | PRN
  Administered 2024-01-14: 40 mg via INTRA_ARTICULAR

## 2024-01-14 MED ORDER — LIDOCAINE HCL 1 % IJ SOLN
3.0000 mL | INTRAMUSCULAR | Status: AC | PRN
  Administered 2024-01-14: 3 mL

## 2024-01-14 NOTE — Progress Notes (Signed)
 The patient is now over 16 months out from a work-related accident in which she sustained a right humeral head fracture that was nondisplaced.  The fracture had since healed and this was confirmed radiographically but she continued to have limitations in range of motion with pain and weakness with her right shoulder. She has had steroid injections in the right shoulder as well as activity modification and rest.  She has tried anti-inflammatories as well.  She continued to have discomfort with the right shoulder so at this point a MRI was warranted of the right shoulder to assess the rotator cuff as well as the cartilage of her shoulder and the bone in general.  She is here in follow-up today to go over the MRI of her right shoulder.  Per the MRI report, the right shoulder humeral head fracture is completely healed.  There is no cartilage irregularity in the humeral head or the glenoid.  The rotator cuff is intact and there is no tear of the rotator cuff.  There is just some mild tendinosis of the rotator cuff.  There is mild arthritic changes at the [facility] joint.  The remainder of the shoulder structures are all intact and appear normal.  Again the fracture has completely healed.  Originally she has only had 1 physical therapy session for her shoulder.  Worker's Comp. is finally approved for the physical therapy says she has had about 5 sessions.  She says her biggest limitation is some range of motion of that shoulder and a little bit achy pain.  On exam her main limitation with range of motion is reaching behind her.  She is passively about the mid lumbar spine area where actively we can get her to just a little bit higher.  With her noninjured left side she is able to reach up in the thoracic region.  Her external rotation is full as well as her abduction and forward flexion.  There is no weakness of the rotator cuff on exam.  She has been on light duty work so we will keep her on light duty for at least 1  more week so she participates in continued physical therapy on her right shoulder.  I did recommend a steroid injection in the subacromial outlet given the mild tendinosis of her shoulder rotator cuff and she agreed to this and tolerated the injection well.  We will see her back in 4 weeks to see how she is doing overall.    Procedure Note  Patient: Vanessa Shah             Date of Birth: 1965-12-28           MRN: [id number]             Visit Date: 01/14/2024  Procedures: Visit Diagnoses:  1. Chronic right shoulder pain     Large Joint Inj: R subacromial bursa on 01/14/2024 8:37 AM Indications: pain and diagnostic evaluation Details: 22 G 1.5 in needle  Arthrogram: No  Medications: 3 mL lidocaine 1 %; 40 mg methylPREDNISolone acetate 40 MG/ML Outcome: tolerated well, no immediate complications Procedure, treatment alternatives, risks and benefits explained, specific risks discussed. Consent was given by the patient. Immediately prior to procedure a time out was called to verify the correct patient, procedure, equipment, support staff and site/side marked as required. Patient was prepped and draped in the usual sterile fashion.

## 2024-02-02 ENCOUNTER — Telehealth (HOSPITAL_BASED_OUTPATIENT_CLINIC_OR_DEPARTMENT_OTHER): Payer: Self-pay | Admitting: *Deleted

## 2024-02-02 DIAGNOSIS — F5101 Primary insomnia: Secondary | ICD-10-CM

## 2024-02-02 NOTE — Telephone Encounter (Signed)
 Patient requesting refill on zolpidem ER 12.5mg  tablets sent to [facility] pharmacy

## 2024-02-04 MED ORDER — ZOLPIDEM TARTRATE ER 12.5 MG PO TBCR: 12.5000 mg | EXTENDED_RELEASE_TABLET | Freq: Every evening | ORAL | 0 refills | Status: DC | PRN

## 2024-02-04 NOTE — Addendum Note (Signed)
 Addended by: DE Peru, Wah Sabic J on: 02/04/2024 10:28 PM   Modules accepted: Orders

## 2024-02-05 ENCOUNTER — Encounter (HOSPITAL_BASED_OUTPATIENT_CLINIC_OR_DEPARTMENT_OTHER): Payer: Self-pay | Admitting: *Deleted

## 2024-02-09 ENCOUNTER — Other Ambulatory Visit (HOSPITAL_BASED_OUTPATIENT_CLINIC_OR_DEPARTMENT_OTHER): Payer: Self-pay | Admitting: Family Medicine

## 2024-02-09 ENCOUNTER — Telehealth (HOSPITAL_BASED_OUTPATIENT_CLINIC_OR_DEPARTMENT_OTHER): Payer: Self-pay | Admitting: Family Medicine

## 2024-02-09 DIAGNOSIS — F411 Generalized anxiety disorder: Secondary | ICD-10-CM

## 2024-02-09 DIAGNOSIS — F329 Major depressive disorder, single episode, unspecified: Secondary | ICD-10-CM

## 2024-02-09 NOTE — Telephone Encounter (Signed)
 AUTHORIZATION HAS BEEN SUBMITTED AWAITING DETERMINATION

## 2024-02-09 NOTE — Telephone Encounter (Signed)
 Copied from CRM [id number]. Topic: Clinical - Prescription Issue >> Feb 09, 2024  1:14 PM Higinio Roger wrote: Reason for CRM: Pharmacy stated prior authorization is needed for zolpidem (AMBIEN CR) 12.5 MG CR tablet.

## 2024-02-09 NOTE — Telephone Encounter (Signed)
 Copied from CRM [id number]. Topic: Clinical - Medication Refill >> Feb 09, 2024  1:15 PM Higinio Roger wrote: Most Recent Primary Care Visit:  Provider: DE Peru, RAYMOND J  Department: [facility]  Visit Type: MYCHART VIDEO VISIT  Date: 12/23/2023  Medication: clonazePAM (KLONOPIN) 1 MG tablet   Has the patient contacted their pharmacy? Yes (Agent: If no, request that the patient contact the pharmacy for the refill. If patient does not wish to contact the pharmacy document the reason why and proceed with request.) (Agent: If yes, when and what did the pharmacy advise?) No refills  Is this the correct pharmacy for this prescription? Yes If no, delete pharmacy and type the correct one.  This is the patient's preferred pharmacy:  [facility] #[id number] [place], [place] - [address] AT [facility] [address] & [facility] [address] [place] [address] Phone: [phone] Fax: [phone]  Has the prescription been filled recently? Yes  Is the patient out of the medication? Yes  Has the patient been seen for an appointment in the last year OR does the patient have an upcoming appointment? Yes  Can we respond through MyChart? Yes  Agent: Please be advised that Rx refills may take up to 3 business days. We ask that you follow-up with your pharmacy.

## 2024-02-10 MED ORDER — ZOLPIDEM TARTRATE 10 MG PO TABS: 10.0000 mg | ORAL_TABLET | Freq: Every evening | ORAL | 0 refills | Status: DC | PRN

## 2024-02-10 MED ORDER — CLONAZEPAM 1 MG PO TABS: 0.5000 mg | ORAL_TABLET | Freq: Every day | ORAL | 0 refills | Status: DC | PRN

## 2024-02-10 NOTE — Telephone Encounter (Signed)
 Spoke with patient advised with verbal understanding

## 2024-02-11 ENCOUNTER — Ambulatory Visit (INDEPENDENT_AMBULATORY_CARE_PROVIDER_SITE_OTHER): Payer: Worker's Compensation | Admitting: Orthopaedic Surgery

## 2024-02-11 ENCOUNTER — Telehealth: Payer: Self-pay | Admitting: Radiology

## 2024-02-11 ENCOUNTER — Encounter: Payer: Self-pay | Admitting: Orthopaedic Surgery

## 2024-02-11 DIAGNOSIS — S42291S Other displaced fracture of upper end of right humerus, sequela: Secondary | ICD-10-CM

## 2024-02-11 DIAGNOSIS — G8929 Other chronic pain: Secondary | ICD-10-CM | POA: Diagnosis not present

## 2024-02-11 DIAGNOSIS — M25511 Pain in right shoulder: Secondary | ICD-10-CM

## 2024-02-11 NOTE — Progress Notes (Signed)
 The patient is here for follow-up as it relates to her right shoulder injury that occurred between 17 and 18 months ago.  She sustained a right humeral head fracture.  The MRI earlier this year showed the fracture to heal completely and there was no other damage within the shoulder.  The rotator cuff was intact.  She is here today with a case Production designer, theatre/television/film.  She has going through physical therapy and has 1 more session.  She still has some aches and pains with the right shoulder.  On my exam today her motion is entirely full.  She has good strength the rotator cuff.  She has pain at different aspects of the motion of her shoulder.  At this point we need to send her for a FCE in order to get an objective opinion about her motion, strength as well as an idea of what she can and cannot do from a work standpoint.  We would then see her back in follow-up for final visit for a disability assessment and calculation.  She will continue light duty work until then.  She will continue occasional meloxicam for inflammatory pain on and off.

## 2024-02-11 NOTE — Telephone Encounter (Signed)
 Patients Nurse Case Manager.  Damita Dunnings, RN,ASN,BS Field Case Manager  O: [phone] C: [phone] F: [phone]  [email or web address]

## 2024-02-12 NOTE — Telephone Encounter (Signed)
 Called [facility] to see what was going on with prescription. Pharmacist stated even with new prescription insurance has denied this as well as they are wanting her to take 1/2 pill daily or 1 every other day. They are only allowing for 15 per month. As far as the clonazepam goes prescription is there, pharmacist refused refill until 4-5 as she had already received 60 pills and it is not due for pick up until 4-5.  Please advise if you want to change zolpidem or if you would like for her to take 1/2 daily

## 2024-02-12 NOTE — Telephone Encounter (Signed)
 Pt advised with verbal understanding pt upset as she stated pharmacy and insurance is deciding how she takes her medication.   Advised provider okay with 1/2 tablet and could pick up clonazepam on date of pharmacy approval.  Pt hung up.

## 2024-03-15 ENCOUNTER — Ambulatory Visit: Admitting: Orthopaedic Surgery

## 2024-03-17 ENCOUNTER — Ambulatory Visit (INDEPENDENT_AMBULATORY_CARE_PROVIDER_SITE_OTHER): Payer: Worker's Compensation | Admitting: Orthopaedic Surgery

## 2024-03-17 DIAGNOSIS — S42291S Other displaced fracture of upper end of right humerus, sequela: Secondary | ICD-10-CM | POA: Diagnosis not present

## 2024-03-17 DIAGNOSIS — M25511 Pain in right shoulder: Secondary | ICD-10-CM

## 2024-03-17 DIAGNOSIS — G8929 Other chronic pain: Secondary | ICD-10-CM | POA: Diagnosis not present

## 2024-03-17 NOTE — Progress Notes (Signed)
 The patient is a 58 year old female who is following up after having a functional capacity evaluation as it relates to a work-related injury to her right dominant shoulder.  This injury occurred between 18 and 19 months ago.  She sustained a right humeral head fracture after mechanical fall.  A MRI of her right shoulder earlier this year showed the fracture completely healed and there was no other damage to the right shoulder.  Her rotator cuff is intact.  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  She was originally hired as a Production assistant, radio.  She still has some aches and pains with the right shoulder.  On exam today her range of motion is almost entirely full with abduction and forward flexion.  Her internal rotation with adduction is slightly limited to the upper lumbar spine where she can reach her with her left noninjured and nondominant side.  From my standpoint she has good strength of her shoulder which is 4+ to almost 5 out of 5 with different activities of the shoulder but it is painful for her when her shoulder is stressed.  The FCE is reviewed.  It shows that she demonstrated the ability to perform at a light physical demand level with abilities into the medium physical demand level.  From my standpoint, she is released to full activities as comfort allows.  However from a work standpoint she needs to only work at the light to [employer] physical demand level with only occasionally lifting up to 35 pounds and no repetitive overhead activities or overhead lifting.  She is able to lift and carry things at the 35 pound level occasionally and at the 20 pound level more frequently.  She can push and pull a force up to 50 pounds but that is not lifting anything above the waist greater than 35 pounds.  From a disability standpoint, her right shoulder pain with some decrease in motion but a healed fracture and no anatomical disruption of the shoulder in the  rotator cuff equates to a permanent partial disability of only 10% from my calculation.  Again there is a lot of the goes into this type of disability rating and there are subjective and objective components.  Her disability comes from continued pain in the shoulder and some decrease in range of motion.  At this point, follow-up for her shoulder is as needed.

## 2024-03-22 ENCOUNTER — Other Ambulatory Visit (HOSPITAL_BASED_OUTPATIENT_CLINIC_OR_DEPARTMENT_OTHER): Payer: Self-pay | Admitting: *Deleted

## 2024-03-22 ENCOUNTER — Other Ambulatory Visit (HOSPITAL_COMMUNITY): Payer: Self-pay | Admitting: Family Medicine

## 2024-03-22 DIAGNOSIS — Z0000 Encounter for general adult medical examination without abnormal findings: Secondary | ICD-10-CM | POA: Diagnosis not present

## 2024-03-23 ENCOUNTER — Ambulatory Visit (HOSPITAL_BASED_OUTPATIENT_CLINIC_OR_DEPARTMENT_OTHER): Payer: Self-pay | Admitting: Family Medicine

## 2024-03-23 LAB — CBC WITH DIFFERENTIAL/PLATELET
Basophils Absolute: 0 10*3/uL (ref 0.0–0.2)
Basos: 0 %
EOS (ABSOLUTE): 0.2 10*3/uL (ref 0.0–0.4)
Eos: 3 %
Hematocrit: 42.2 % (ref 34.0–46.6)
Hemoglobin: 13.9 g/dL (ref 11.1–15.9)
Immature Grans (Abs): 0 10*3/uL (ref 0.0–0.1)
Immature Granulocytes: 0 %
Lymphocytes Absolute: 1.5 10*3/uL (ref 0.7–3.1)
Lymphs: 24 %
MCH: 30 pg (ref 26.6–33.0)
MCHC: 32.9 g/dL (ref 31.5–35.7)
MCV: 91 fL (ref 79–97)
Monocytes Absolute: 0.5 10*3/uL (ref 0.1–0.9)
Monocytes: 8 %
Neutrophils Absolute: 4.1 10*3/uL (ref 1.4–7.0)
Neutrophils: 65 %
Platelets: 337 10*3/uL (ref 150–450)
RBC: 4.63 x10E6/uL (ref 3.77–5.28)
RDW: 12.7 % (ref 11.7–15.4)
WBC: 6.4 10*3/uL (ref 3.4–10.8)

## 2024-03-23 LAB — COMPREHENSIVE METABOLIC PANEL WITH GFR
ALT: 18 IU/L (ref 0–32)
AST: 18 IU/L (ref 0–40)
Albumin: 4.1 g/dL (ref 3.8–4.9)
Alkaline Phosphatase: 99 IU/L (ref 44–121)
BUN/Creatinine Ratio: 5 — ABNORMAL LOW (ref 9–23)
BUN: 5 mg/dL — ABNORMAL LOW (ref 6–24)
Bilirubin Total: 0.5 mg/dL (ref 0.0–1.2)
CO2: 21 mmol/L (ref 20–29)
Calcium: 9.2 mg/dL (ref 8.7–10.2)
Chloride: 102 mmol/L (ref 96–106)
Creatinine, Ser: 0.94 mg/dL (ref 0.57–1.00)
Globulin, Total: 2.6 g/dL (ref 1.5–4.5)
Glucose: 88 mg/dL (ref 70–99)
Potassium: 4.3 mmol/L (ref 3.5–5.2)
Sodium: 144 mmol/L (ref 134–144)
Total Protein: 6.7 g/dL (ref 6.0–8.5)
eGFR: 71 mL/min/{1.73_m2} (ref >59)

## 2024-03-23 LAB — LIPID PANEL
Chol/HDL Ratio: 5.1 ratio — ABNORMAL HIGH (ref 0.0–4.4)
Cholesterol, Total: 215 mg/dL — ABNORMAL HIGH (ref 100–199)
HDL: 42 mg/dL (ref >39)
LDL Chol Calc (NIH): 142 mg/dL — ABNORMAL HIGH (ref 0–99)
Triglycerides: 174 mg/dL — ABNORMAL HIGH (ref 0–149)
VLDL Cholesterol Cal: 31 mg/dL (ref 5–40)

## 2024-03-23 LAB — HEMOGLOBIN A1C
Est. average glucose Bld gHb Est-mCnc: 120 mg/dL
Hgb A1c MFr Bld: 5.8 % — ABNORMAL HIGH (ref 4.8–5.6)

## 2024-03-23 LAB — TSH RFX ON ABNORMAL TO FREE T4: TSH: 1.77 u[IU]/mL (ref 0.450–4.500)

## 2024-03-25 ENCOUNTER — Encounter (HOSPITAL_BASED_OUTPATIENT_CLINIC_OR_DEPARTMENT_OTHER): Payer: Self-pay | Admitting: Family Medicine

## 2024-03-25 ENCOUNTER — Ambulatory Visit (INDEPENDENT_AMBULATORY_CARE_PROVIDER_SITE_OTHER): Payer: BC Managed Care – PPO | Admitting: Family Medicine

## 2024-03-25 VITALS — BP 148/98 | HR 70 | Ht 69.0 in | Wt 191.5 lb

## 2024-03-25 DIAGNOSIS — E785 Hyperlipidemia, unspecified: Secondary | ICD-10-CM | POA: Diagnosis not present

## 2024-03-25 DIAGNOSIS — Z0000 Encounter for general adult medical examination without abnormal findings: Secondary | ICD-10-CM

## 2024-03-25 DIAGNOSIS — R059 Cough, unspecified: Secondary | ICD-10-CM | POA: Diagnosis not present

## 2024-03-25 MED ORDER — DOXYCYCLINE HYCLATE 100 MG PO TABS: 100.0000 mg | ORAL_TABLET | Freq: Two times a day (BID) | ORAL | 0 refills | Status: AC

## 2024-03-25 NOTE — Patient Instructions (Signed)
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
## 2024-03-25 NOTE — Assessment & Plan Note (Addendum)
 Routine HCM labs reviewed. HCM reviewed/discussed. Anticipatory guidance regarding healthy weight, lifestyle and choices given. Recommend healthy diet.  Recommend approximately 150 minutes/week of moderate intensity exercise Recommend regular dental and vision exams Always use seatbelt/lap and shoulder restraints Recommend using smoke alarms and checking batteries at least twice a year Recommend using sunscreen when outside Discussed colon cancer screening recommendations, options.  Patient is UTD Discussed recommendations for shingles vaccine.  Patient reports having received first dose, needs second still.  Advised on having this completed at her earliest convenience Discussed immunization recommendations

## 2024-03-25 NOTE — Assessment & Plan Note (Signed)
 Reports that she has had recent illness, started about 2 weeks ago.  Has been having cough, sinus congestion.  Symptoms primarily started after returning from a trip to [place].  She is not aware of any fevers.  Has had mild shortness of breath with certain activities like walking up the stairs. On exam, patient is in no acute distress.  Normal oropharyngeal exam, lungs clear to auscultation bilaterally. Given history and exam, it is possible that initial viral infection may have transition to a superimposed bacterial infection.  She does indicate allergy to penicillin in the past, has tolerated doxycycline previously.  We can look to treat with doxycycline.  Advised on potential side effects.  Can utilize OTC medications to help with symptom control.  Discussed potential use of honey to help with cough.  Did discuss that in some cases cough can linger for up to 4 to 6 weeks after illness.  If symptoms do persist despite conservative measures and antibiotic therapy, recommend returning for further evaluation

## 2024-04-29 ENCOUNTER — Other Ambulatory Visit (HOSPITAL_BASED_OUTPATIENT_CLINIC_OR_DEPARTMENT_OTHER): Payer: Self-pay | Admitting: *Deleted

## 2024-04-29 DIAGNOSIS — M545 Low back pain, unspecified: Secondary | ICD-10-CM

## 2024-04-29 MED ORDER — MELOXICAM 15 MG PO TABS: 15.0000 mg | ORAL_TABLET | Freq: Every day | ORAL | 0 refills | Status: DC

## 2024-05-04 ENCOUNTER — Encounter (HOSPITAL_BASED_OUTPATIENT_CLINIC_OR_DEPARTMENT_OTHER): Admitting: Certified Nurse Midwife

## 2024-05-18 ENCOUNTER — Encounter (HOSPITAL_BASED_OUTPATIENT_CLINIC_OR_DEPARTMENT_OTHER): Payer: Self-pay | Admitting: Family Medicine

## 2024-05-18 MED ORDER — ZOLPIDEM TARTRATE 10 MG PO TABS: 10.0000 mg | ORAL_TABLET | Freq: Every evening | ORAL | 0 refills | Status: DC | PRN

## 2024-05-24 ENCOUNTER — Other Ambulatory Visit (HOSPITAL_COMMUNITY)
Admission: RE | Admit: 2024-05-24 | Discharge: 2024-05-24 | Disposition: A | Discharge status: Home / Self Care | Source: Ambulatory Visit | Attending: Certified Nurse Midwife | Admitting: Certified Nurse Midwife

## 2024-05-24 ENCOUNTER — Ambulatory Visit (HOSPITAL_BASED_OUTPATIENT_CLINIC_OR_DEPARTMENT_OTHER): Admitting: Certified Nurse Midwife

## 2024-05-24 ENCOUNTER — Encounter (HOSPITAL_BASED_OUTPATIENT_CLINIC_OR_DEPARTMENT_OTHER): Payer: Self-pay | Admitting: Certified Nurse Midwife

## 2024-05-24 VITALS — BP 126/81 | HR 69 | Ht 67.5 in | Wt 186.2 lb

## 2024-05-24 DIAGNOSIS — Z30432 Encounter for removal of intrauterine contraceptive device: Secondary | ICD-10-CM | POA: Insufficient documentation

## 2024-05-24 DIAGNOSIS — Z01419 Encounter for gynecological examination (general) (routine) without abnormal findings: Secondary | ICD-10-CM

## 2024-05-24 DIAGNOSIS — Z124 Encounter for screening for malignant neoplasm of cervix: Secondary | ICD-10-CM | POA: Diagnosis not present

## 2024-05-24 DIAGNOSIS — Z113 Encounter for screening for infections with a predominantly sexual mode of transmission: Secondary | ICD-10-CM
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
## 2024-05-27 LAB — CYTOLOGY - PAP
Adequacy: ABSENT
Chlamydia: NEGATIVE
Comment: NEGATIVE
Comment: NEGATIVE
Comment: NEGATIVE
Comment: NORMAL
Diagnosis: NEGATIVE
High risk HPV: NEGATIVE
Neisseria Gonorrhea: NEGATIVE
Trichomonas: NEGATIVE

## 2024-05-28 ENCOUNTER — Ambulatory Visit (HOSPITAL_BASED_OUTPATIENT_CLINIC_OR_DEPARTMENT_OTHER): Payer: Self-pay | Admitting: Certified Nurse Midwife

## 2024-07-14 ENCOUNTER — Encounter (HOSPITAL_BASED_OUTPATIENT_CLINIC_OR_DEPARTMENT_OTHER): Payer: Self-pay | Admitting: Family Medicine

## 2024-07-28 ENCOUNTER — Encounter (HOSPITAL_BASED_OUTPATIENT_CLINIC_OR_DEPARTMENT_OTHER): Payer: Self-pay | Admitting: Family Medicine

## 2024-07-28 ENCOUNTER — Telehealth (HOSPITAL_BASED_OUTPATIENT_CLINIC_OR_DEPARTMENT_OTHER): Admitting: Family Medicine

## 2024-07-28 DIAGNOSIS — F329 Major depressive disorder, single episode, unspecified: Secondary | ICD-10-CM

## 2024-07-28 DIAGNOSIS — F5101 Primary insomnia: Secondary | ICD-10-CM | POA: Diagnosis not present

## 2024-07-28 DIAGNOSIS — F411 Generalized anxiety disorder: Secondary | ICD-10-CM | POA: Diagnosis not present

## 2024-07-28 DIAGNOSIS — R059 Cough, unspecified: Secondary | ICD-10-CM | POA: Diagnosis not present

## 2024-07-28 MED ORDER — CLONAZEPAM 1 MG PO TABS: 0.5000 mg | ORAL_TABLET | Freq: Every day | ORAL | 0 refills | Status: DC | PRN

## 2024-07-28 MED ORDER — ZOLPIDEM TARTRATE 10 MG PO TABS: 10.0000 mg | ORAL_TABLET | Freq: Every evening | ORAL | 0 refills | Status: DC | PRN

## 2024-07-28 MED ORDER — BUPROPION HCL ER (XL) 150 MG PO TB24: ORAL_TABLET | ORAL | 3 refills | Status: DC

## 2024-07-28 NOTE — Progress Notes (Unsigned)
 Virtual Visit   I connected with  Mishaal Lansdale  on 07/29/24 by telehealth and verified that I am speaking with the correct person using two identifiers. Visit completed via video.   I discussed the limitations, risks, security and privacy concerns of performing an evaluation and management service by telephone, including the higher likelihood of inaccurate diagnosis and treatment, and the availability of in person appointments.  We also discussed the likely need of an additional face to face encounter for complete and high quality delivery of care.  I also discussed with the patient that there may be a patient responsible charge related to this service. The patient expressed understanding and wishes to proceed.  Provider location is in medical facility. Patient location is at their home, different from provider location. People involved in care of the patient during this telehealth encounter were myself, my nurse/medical assistant, and my front office/scheduling team member.  Review of Systems: No fevers, chills, night sweats, weight loss, chest pain, or shortness of breath.   Objective Findings:    General: Speaking full sentences, no audible heavy breathing.  Sounds alert and appropriately interactive.    Independent interpretation of tests performed by another provider:   None.  Brief History, Exam, Impression, and Recommendations:    GAD (generalized anxiety disorder) Patient currently manages with Wellbutrin  and Prozac  with use of Klonopin  as needed. We have previously placed psychiatry referral, however she has not scheduled yet -indicates that she is looking for an office able to do virtual visits.  She has been reviewing possibilities through her patient portal with her insurance company.  She has engaged in counseling in the past, however not currently doing so.  She is requesting refill of Wellbutrin  today. We discussed considerations and importance of establishing  with psychiatry.  If arranging for virtual visit with psychiatrist it is most feasible, then certainly it is reasonable to proceed with this.  Advised that if she does identify an office and requires referral in order to schedule establishing visit, to let us  know and we can place referral to preferred office PDMP reviewed today, no red flags.  Cough Reports that during August she had mild illness with cough.  She overall is improved, however does have intermittent cough present still.  Cough will be occasionally productive.  She denies any significant sinus issues, no rhinorrhea, no postnasal drip that she has appreciated.  Denies any significant shortness of breath or trouble breathing.  No recent fever, sweats. Discussed limitations with virtual evaluation, including inability to directly auscultate lungs.  She appears to be in no distress on exam during video call today. We discussed options and also discussed that in some cases cough can linger as part of postviral cough syndrome and this could take up to 6 weeks to resolve.  Patient will monitor cough over the next week or 2 and if still persisting, can proceed with chest x-ray.  Order has been placed for this.  If any changes are noted such as developing fever or shortness of breath, recommend coming into office for evaluation or presenting to urgent care or emergency department.  Primary insomnia Patient continues with use of Ambien  to assist with insomnia management.  She has not noticed any adverse effects from medication.  She is requesting refill today.  PDMP reviewed, no red flags observed.  Can proceed with refill of Ambien  at this time.  Again discussed potential risks and adverse effects related to medication.  Also cautioned on use of this  medication with other medications patient is taking, in particular benzodiazepine. As discussed separately and at prior office visits, it will be important to establish with psychiatry for ongoing  medication management.  See separate discussion regarding patient arranging for psychiatry appointment  I discussed the above assessment and treatment plan with the patient. The patient was provided an opportunity to ask questions and all were answered. The patient agreed with the plan and demonstrated an understanding of the instructions.  The patient was advised to call back or seek an in-person evaluation if the symptoms worsen or if the condition fails to improve as anticipated.  I provided 14 minutes of face to face and non-face-to-face time during this encounter date, time was needed to gather information, review chart, records, communicate/coordinate with staff remotely, as well as complete documentation.   ___________________________________________ Schylar Allard de [place], MD, ABFM, [facility] Primary Care and Sports Medicine [facility]

## 2024-07-29 NOTE — Assessment & Plan Note (Signed)
 Patient currently manages with Wellbutrin  and Prozac  with use of Klonopin  as needed. We have previously placed psychiatry referral, however she has not scheduled yet -indicates that she is looking for an office able to do virtual visits.  She has been reviewing possibilities through her patient portal with her insurance company.  She has engaged in counseling in the past, however not currently doing so.  She is requesting refill of Wellbutrin  today. We discussed considerations and importance of establishing with psychiatry.  If arranging for virtual visit with psychiatrist it is most feasible, then certainly it is reasonable to proceed with this.  Advised that if she does identify an office and requires referral in order to schedule establishing visit, to let us  know and we can place referral to preferred office PDMP reviewed today, no red flags.

## 2024-07-29 NOTE — Assessment & Plan Note (Signed)
 Patient continues with use of Ambien  to assist with insomnia management.  She has not noticed any adverse effects from medication.  She is requesting refill today.  PDMP reviewed, no red flags observed.  Can proceed with refill of Ambien  at this time.  Again discussed potential risks and adverse effects related to medication.  Also cautioned on use of this medication with other medications patient is taking, in particular benzodiazepine. As discussed separately and at prior office visits, it will be important to establish with psychiatry for ongoing medication management.  See separate discussion regarding patient arranging for psychiatry appointment

## 2024-07-29 NOTE — Assessment & Plan Note (Signed)
 Reports that during August she had mild illness with cough.  She overall is improved, however does have intermittent cough present still.  Cough will be occasionally productive.  She denies any significant sinus issues, no rhinorrhea, no postnasal drip that she has appreciated.  Denies any significant shortness of breath or trouble breathing.  No recent fever, sweats. Discussed limitations with virtual evaluation, including inability to directly auscultate lungs.  She appears to be in no distress on exam during video call today. We discussed options and also discussed that in some cases cough can linger as part of postviral cough syndrome and this could take up to 6 weeks to resolve.  Patient will monitor cough over the next week or 2 and if still persisting, can proceed with chest x-ray.  Order has been placed for this.  If any changes are noted such as developing fever or shortness of breath, recommend coming into office for evaluation or presenting to urgent care or emergency department.

## 2024-09-13 ENCOUNTER — Encounter: Payer: Self-pay | Admitting: Radiology

## 2024-09-21 ENCOUNTER — Ambulatory Visit: Admitting: Family Medicine

## 2024-09-21 ENCOUNTER — Encounter: Payer: Self-pay | Admitting: Family Medicine

## 2024-09-21 ENCOUNTER — Other Ambulatory Visit: Payer: Self-pay

## 2024-09-21 ENCOUNTER — Other Ambulatory Visit (HOSPITAL_BASED_OUTPATIENT_CLINIC_OR_DEPARTMENT_OTHER): Payer: Self-pay | Admitting: *Deleted

## 2024-09-21 ENCOUNTER — Ambulatory Visit (INDEPENDENT_AMBULATORY_CARE_PROVIDER_SITE_OTHER)

## 2024-09-21 VITALS — BP 154/96 | HR 69 | Ht 67.5 in | Wt 194.0 lb

## 2024-09-21 DIAGNOSIS — M25562 Pain in left knee: Secondary | ICD-10-CM | POA: Diagnosis not present

## 2024-09-21 DIAGNOSIS — M7122 Synovial cyst of popliteal space [Baker], left knee: Secondary | ICD-10-CM | POA: Insufficient documentation

## 2024-09-21 DIAGNOSIS — G8929 Other chronic pain: Secondary | ICD-10-CM | POA: Diagnosis not present

## 2024-09-21 DIAGNOSIS — M1710 Unilateral primary osteoarthritis, unspecified knee: Secondary | ICD-10-CM | POA: Diagnosis not present

## 2024-09-21 DIAGNOSIS — F411 Generalized anxiety disorder: Secondary | ICD-10-CM

## 2024-09-21 DIAGNOSIS — F329 Major depressive disorder, single episode, unspecified: Secondary | ICD-10-CM

## 2024-09-21 NOTE — Progress Notes (Signed)
 I, Vanessa Shah, CMA acting as a scribe for Vanessa Lloyd, MD.  Vanessa Shah is a 58 y.o. female who presents to [facility] today for left knee pain. Avid runner.   Today, patient reports exacerbation of left knee sx over the past year. Some soreness after running. On 08/14/24, ran a half marathon, around mile 8, she started having knee pain which impacted her performance during the race. It took about 1 week for soreness to resolve. There has been intermittent swelling. More recently, was walking the dog, her knees were locked, and the dog suddenly ran off pulling hard on the leash. Recalls feeling / hearing a pop at the time of injury. Notes increased intermittent swelling since that time. Has tried alternating heat/ice, using BioFreeze. Has also been going to [facility] d/t soreness in the L LE / hip. Location of pain has changed over time. Having more pain at the medial and lateral aspects, as well as posterior aspect. Short-term relief with Mobic .    Pertinent review of systems: No fevers or chills  Relevant historical information: History of a humeral head fracture.  Patient is an active runner who likes to participate in half marathons. She has a [employer].   Exam:  BP (!) 154/96   Pulse 69   Ht 5' 7.5 (1.715 m)   Wt 194 lb (88 kg)   SpO2 98%   BMI 29.94 kg/m  General: Well Developed, well nourished, and in no acute distress.   MSK: Left knee normal-appearing no significant obvious joint effusion.  Normal motion. Stable ligamentous exam. Intact strength. Mildly positive medial McMurray's test. Posterior nodule palpated posterior medial knee.    Lab and Radiology Results  Procedure: Real-time Ultrasound Guided aspiration and injection of left knee Baker's cyst Device: Philips Affiniti 50G/GE Logiq Images permanently stored and available for review in PACS Ultrasound evaluation prior to aspiration and injection reveals a  moderate loculated Baker's cyst posterior medial knee.  Otherwise knee joint is largely unremarkable Verbal informed consent obtained.  Discussed risks and benefits of procedure. Warned about infection, bleeding, hyperglycemia damage to structures among others. Patient expresses understanding and agreement Time-out conducted.   Noted no overlying erythema, induration, or other signs of local infection.   Skin prepped in a sterile fashion.   Local anesthesia: Topical Ethyl chloride.   With sterile technique and under real time ultrasound guidance: 2 mL of lidocaine  injected into continuous tissue superficial to Baker's cyst achieving good anesthesia. Skin again sterilized with isopropyl alcohol. 18-gauge needle used to access the loculated Baker's cyst. A small amount of clear straw-colored fluid was aspirated. Needle was repositioned under ultrasound guidance several times but only a scant amount of fluid was able to be aspirated.  The Baker's cyst remained on decompressed following this procedure. Syringe was exchanged and 40 mg of Kenalog  and 2 mL eaters of Marcaine  were injected into the Baker's cyst..  Completed without difficulty   Pain moderately immediately resolved suggesting accurate placement of the medication.   Advised to call if fevers/chills, erythema, induration, drainage, or persistent bleeding.   Images permanently stored and available for review in the ultrasound unit.  Impression: Technically successful ultrasound guided injection.    X-ray images left knee obtained today personally and independently interpreted. Mild medial and patellofemoral DJD.  No acute fractures. Await formal radiology review    Assessment and Plan: 58 y.o. 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  She may  have a degenerative meniscus tear as well.  Plan for Baker's cyst injection and Voltaren gel and compression sleeve.  She can restart running as tolerated by pain.  If not doing  well communicate back with me and we can proceed with MRI to further evaluate Baker's cyst and other source of knee pain and for potential surgical planning.   PDMP not reviewed this encounter. Orders Placed This Encounter  Procedures   US  LIMITED JOINT SPACE STRUCTURES LOW LEFT(NO LINKED CHARGES)    Reason for Exam (SYMPTOM  OR DIAGNOSIS REQUIRED):   left knee pain    Preferred imaging location?:   [facility] Knee AP/LAT W/Sun[place] Left    Standing Status:   Future    Number of Occurrences:   1    Expiration Date:   10/21/2024    Reason for Exam (SYMPTOM  OR DIAGNOSIS REQUIRED):   left knee pain    Preferred imaging location?:   [facility]    Is patient pregnant?:   No   No orders of the defined types were placed in this encounter.    Discussed warning signs or symptoms. Please see discharge instructions. Patient expresses understanding.   The above documentation has been reviewed and is accurate and complete Vanessa Shah, M.D.

## 2024-09-21 NOTE — Patient Instructions (Addendum)
 Thank you for coming in today.   Please get an Xray today before you leave   Please use Voltaren gel (Generic Diclofenac Gel) up to 4x daily for pain as needed.  This is available over-the-counter as both the name brand Voltaren gel and the generic diclofenac gel.   I recommend you obtained a compression sleeve to help with your joint problems. There are many options on the market however I recommend obtaining a full knee Body Helix compression sleeve.  You can find information (including how to appropriate measure yourself for sizing) can be found at [email or web address] [email or web address].  Many of these products are health savings account (HSA) eligible.   You can use the compression sleeve at any time throughout the day but is most important to use while being active as well as for 2 hours post-activity.   It is appropriate to ice following activity with the compression sleeve in place.   Baker Cyst  A Baker cyst, also called a popliteal cyst, is a growth that forms at the back of the knee. The cyst forms when the fluid-filled sac (bursa) behind the knee joint fills up with more fluid and becomes enlarged. What are the causes? In most cases, a Baker cyst results from another knee problem that causes swelling in the knee. This makes the fluid inside the knee joint (synovial fluid) flow into the bursa behind the knee, causing the bursa to enlarge. The fluid flows in one direction and is unable to move back into the joint. What increases the risk? You may be more likely to develop a Baker cyst if you already have a knee problem, such as: A tear in cartilage that cushions the knee joint (meniscal tear). A tear in the tissues that connect the bones of the knee joint (ligament tear). Knee swelling from osteoarthritis, rheumatoid arthritis, or gout. What are the signs or symptoms? The main symptom of this condition is a lump behind the knee. This may be the only symptom of the condition. The lump may be painful,  especially when the knee is straightened. If the lump is painful, the pain may come and go. The knee may also be stiff. Symptoms may quickly get more severe if the cyst breaks open. If the cyst breaks open, you may feel the following in your knee and calf: Sudden or worsening pain. Swelling. Bruising. Redness in the calf. A Baker cyst does not always cause symptoms. How is this diagnosed? This condition may be diagnosed based on your symptoms and medical history. Your health care provider will also do a physical exam. This may include: Feeling the cyst to check whether it is tender. Checking your knee for signs of another knee condition that causes swelling. You may have imaging tests, such as X-ray, ultrasound, or MRI. How is this treated? A Baker cyst that is not painful may go away without treatment. If the cyst gets large or painful, it will likely get better if the underlying knee problem is treated. If needed, treatment for a Baker cyst may include: Resting. Keeping weight off the knee. This means not leaning on the knee to support your body weight. Taking NSAIDs, such as ibuprofen, to reduce pain and swelling. Draining the fluid from the cyst with a needle (aspiration). Getting a steroid injection into the knee. A steroid reduces swelling. Surgery. This may be needed if other treatments do not work. This usually involves correcting knee damage and removing the cyst. In some cases, you may also need to  do certain exercises to improve movement in the knee (physical therapy). Follow these instructions at home: Activity Rest as told by your provider. Avoid activities that make pain or swelling worse. Do not use the injured limb to support your body weight until your provider says that you can. Use crutches as told by your provider. Raise (elevate) your knee above the level of your heart while you are sitting or lying down. Do physical therapy exercises as told by your provider. Return  to your normal activities as told by your provider. Ask your provider what activities are safe for you. General instructions Take over-the-counter and prescription medicines only as told by your provider. Keep all follow-up visits. Your provider will monitor your healing and adjust your treatment as needed. Contact a health care provider if: You have knee pain, stiffness, or swelling that does not get better. Get help right away if: You have sudden or worsening pain and swelling in your calf area. This information is not intended to replace advice given to you by your health care provider. Make sure you discuss any questions you have with your health care provider. Document Revised: 06/26/2022 Document Reviewed: 06/26/2022 Elsevier Patient Education  2024 [employer].

## 2024-09-22 MED ORDER — ZOLPIDEM TARTRATE 10 MG PO TABS
10.0000 mg | ORAL_TABLET | Freq: Every evening | ORAL | 0 refills | Status: AC | PRN
Start: 2024-09-22 — End: 2024-10-22

## 2024-09-22 MED ORDER — FLUOXETINE HCL 40 MG PO CAPS: 40.0000 mg | ORAL_CAPSULE | Freq: Every day | ORAL | 1 refills | Status: AC

## 2024-09-23 ENCOUNTER — Ambulatory Visit: Payer: Self-pay | Admitting: Family Medicine

## 2024-09-23 NOTE — Progress Notes (Signed)
 Left knee x-ray shows mild arthritis.

## 2024-09-28 ENCOUNTER — Other Ambulatory Visit (HOSPITAL_BASED_OUTPATIENT_CLINIC_OR_DEPARTMENT_OTHER): Payer: Self-pay | Admitting: *Deleted

## 2024-09-28 DIAGNOSIS — M545 Low back pain, unspecified: Secondary | ICD-10-CM

## 2024-09-28 MED ORDER — MELOXICAM 15 MG PO TABS: 15.0000 mg | ORAL_TABLET | Freq: Every day | ORAL | 0 refills | Status: DC

## 2024-10-19 DIAGNOSIS — F331 Major depressive disorder, recurrent, moderate: Secondary | ICD-10-CM | POA: Diagnosis not present

## 2024-10-19 DIAGNOSIS — F411 Generalized anxiety disorder: Secondary | ICD-10-CM | POA: Diagnosis not present

## 2024-10-28 ENCOUNTER — Encounter (HOSPITAL_BASED_OUTPATIENT_CLINIC_OR_DEPARTMENT_OTHER): Payer: Self-pay | Admitting: Family Medicine

## 2024-10-28 ENCOUNTER — Ambulatory Visit (HOSPITAL_BASED_OUTPATIENT_CLINIC_OR_DEPARTMENT_OTHER): Admitting: Family Medicine

## 2024-10-28 VITALS — BP 169/96 | HR 62 | Temp 98.1°F | Resp 18 | Ht 67.5 in | Wt 190.0 lb

## 2024-10-28 DIAGNOSIS — M25569 Pain in unspecified knee: Secondary | ICD-10-CM | POA: Insufficient documentation

## 2024-10-28 DIAGNOSIS — F5101 Primary insomnia: Secondary | ICD-10-CM

## 2024-10-28 DIAGNOSIS — E538 Deficiency of other specified B group vitamins: Secondary | ICD-10-CM

## 2024-10-28 DIAGNOSIS — F411 Generalized anxiety disorder: Secondary | ICD-10-CM | POA: Diagnosis not present

## 2024-10-28 DIAGNOSIS — Z0000 Encounter for general adult medical examination without abnormal findings: Secondary | ICD-10-CM | POA: Diagnosis not present

## 2024-10-28 DIAGNOSIS — M25562 Pain in left knee: Secondary | ICD-10-CM | POA: Diagnosis not present

## 2024-10-28 MED ORDER — ZOLPIDEM TARTRATE 10 MG PO TABS: 10.0000 mg | ORAL_TABLET | Freq: Every evening | ORAL | 0 refills | Status: AC | PRN

## 2024-10-28 NOTE — Assessment & Plan Note (Signed)
 As above, patient has established with psychiatrist.  She continues with Ambien . She has not noticed any adverse effects from medication.  She is requesting refill today.  PDMP reviewed, no red flags observed.  Can proceed with refill of Ambien  at this time.  Again discussed potential risks and adverse effects related to medication.  Also cautioned on use of this medication with other medications patient is taking, in particular benzodiazepine. Given that she has now established with psychiatry, would defer continued management/refill of this medication to psychiatry.  Advised patient of this today.

## 2024-10-28 NOTE — Progress Notes (Signed)
° ° °  Procedures performed today:    None.  Independent interpretation of notes and tests performed by another provider:   None.  Brief History, Exam, Impression, and Recommendations:    BP (!) 169/96 (BP Location: Left Arm, Patient Position: Sitting, Cuff Size: Normal)   Pulse 62   Temp 98.1 F (36.7 C) (Oral)   Resp 18   Ht 5' 7.5 (1.715 m)   Wt 190 lb (86.2 kg)   LMP  (LMP Unknown)   SpO2 98%   BMI 29.32 kg/m   GAD (generalized anxiety disorder) Assessment & Plan: Patient currently manages with Wellbutrin  and Prozac  with use of Klonopin  as needed. We have previously placed psychiatry referral - she reports having established with [facility] recently. Had change in dose in bupropion . Has not been taking long enough to determine if symptoms are improving with dose change Recommend continuing with scheduled follow-up with psychiatry   Primary insomnia Assessment & Plan: As above, patient has established with psychiatrist.  She continues with Ambien . She has not noticed any adverse effects from medication.  She is requesting refill today.  PDMP reviewed, no red flags observed.  Can proceed with refill of Ambien  at this time.  Again discussed potential risks and adverse effects related to medication.  Also cautioned on use of this medication with other medications patient is taking, in particular benzodiazepine. Given that she has now established with psychiatry, would defer continued management/refill of this medication to psychiatry.  Advised patient of this today.   Vitamin B12 deficiency Assessment & Plan: We will recheck vitamin B12 labs with next blood work  Orders: -     Vitamin B12; Future  Wellness examination -     CBC with Differential/Platelet; Future -     Comprehensive metabolic panel with GFR; Future -     Hemoglobin A1c; Future -     Lipid panel; Future -     TSH Rfx on Abnormal to Free T4; Future -     Vitamin B12; Future  Left knee pain, unspecified  chronicity Assessment & Plan: Patient did have appointment with sports medicine provider recently.  She did have x-rays completed as well as aspiration of Baker's cyst.  X-ray did show evidence of arthritis.  Potential concern for meniscal tear based upon evaluation with sports medicine provider.  She generally has been doing well, however has had some episodes where certain activities have triggered pain for her. We discussed considerations related to management.  Can continue with conservative measures, relative rest, activity modification.  We discussed possible referral to physical therapy, consideration for MRI.  For now, she will focus on conservative measures.  If wanting to proceed with referral to PT, she will reach out to us  or to Dr. Margart who she saw previously.   Other orders -     Zolpidem  Tartrate; Take 1 tablet (10 mg total) by mouth at bedtime as needed for sleep.  Dispense: 30 tablet; Refill: 0  Return in about 6 months (around 04/28/2025) for CPE with fasting labs 1 week prior.   ___________________________________________ Keishaun [place], MD, ABFM, CAQSM [facility]

## 2024-10-28 NOTE — Assessment & Plan Note (Signed)
 We will recheck vitamin B12 labs with next blood work

## 2024-10-28 NOTE — Assessment & Plan Note (Signed)
 Patient currently manages with Wellbutrin  and Prozac  with use of Klonopin  as needed. We have previously placed psychiatry referral - she reports having established with [facility] recently. Had change in dose in bupropion . Has not been taking long enough to determine if symptoms are improving with dose change Recommend continuing with scheduled follow-up with psychiatry

## 2024-10-28 NOTE — Assessment & Plan Note (Signed)
 Patient did have appointment with sports medicine provider recently.  She did have x-rays completed as well as aspiration of Baker's cyst.  X-ray did show evidence of arthritis.  Potential concern for meniscal tear based upon evaluation with sports medicine provider.  She generally has been doing well, however has had some episodes where certain activities have triggered pain for her. We discussed considerations related to management.  Can continue with conservative measures, relative rest, activity modification.  We discussed possible referral to physical therapy, consideration for MRI.  For now, she will focus on conservative measures.  If wanting to proceed with referral to PT, she will reach out to us  or to Dr. Margart who she saw previously.

## 2024-11-08 ENCOUNTER — Other Ambulatory Visit (HOSPITAL_BASED_OUTPATIENT_CLINIC_OR_DEPARTMENT_OTHER): Payer: Self-pay

## 2024-11-08 DIAGNOSIS — M545 Low back pain, unspecified: Secondary | ICD-10-CM

## 2024-11-08 MED ORDER — MELOXICAM 15 MG PO TABS: 15.0000 mg | ORAL_TABLET | Freq: Every day | ORAL | 3 refills | Status: AC

## 2024-11-08 NOTE — Telephone Encounter (Signed)
Please approve if refill is appropriate.

## 2024-12-01 ENCOUNTER — Other Ambulatory Visit (HOSPITAL_BASED_OUTPATIENT_CLINIC_OR_DEPARTMENT_OTHER): Payer: Self-pay | Admitting: Family Medicine

## 2024-12-01 DIAGNOSIS — F411 Generalized anxiety disorder: Secondary | ICD-10-CM

## 2024-12-01 DIAGNOSIS — F329 Major depressive disorder, single episode, unspecified: Secondary | ICD-10-CM

## 2024-12-01 MED ORDER — CLONAZEPAM 1 MG PO TABS: 0.5000 mg | ORAL_TABLET | Freq: Every day | ORAL | 0 refills | Status: AC | PRN

## 2025-05-02 ENCOUNTER — Encounter (HOSPITAL_BASED_OUTPATIENT_CLINIC_OR_DEPARTMENT_OTHER): Admitting: Family Medicine
